# Patient Record
Sex: Female | Born: 1985 | Race: White | Hispanic: Yes | Marital: Married | Smoking: Never smoker
Health system: Southern US, Community
[De-identification: ages and names within clinical notes are randomized; demographics above are authoritative.]

## PROBLEM LIST (undated history)

## (undated) ENCOUNTER — Inpatient Hospital Stay (HOSPITAL_COMMUNITY): Payer: Self-pay

## (undated) DIAGNOSIS — Z789 Other specified health status: Secondary | ICD-10-CM

## (undated) DIAGNOSIS — K831 Obstruction of bile duct: Secondary | ICD-10-CM

## (undated) HISTORY — PX: NO PAST SURGERIES: SHX2092

## (undated) HISTORY — DX: Obstruction of bile duct: K83.1

## (undated) HISTORY — PX: OTHER SURGICAL HISTORY: SHX169

---

## 2007-07-29 ENCOUNTER — Inpatient Hospital Stay (HOSPITAL_COMMUNITY): Admission: AD | Admit: 2007-07-29 | Discharge: 2007-07-29 | Payer: Self-pay | Admitting: Gynecology

## 2007-08-05 ENCOUNTER — Inpatient Hospital Stay (HOSPITAL_COMMUNITY): Admission: RE | Admit: 2007-08-05 | Discharge: 2007-08-05 | Payer: Self-pay | Admitting: Obstetrics and Gynecology

## 2007-10-29 ENCOUNTER — Ambulatory Visit (HOSPITAL_COMMUNITY): Admission: RE | Admit: 2007-10-29 | Discharge: 2007-10-29 | Payer: Self-pay | Admitting: Obstetrics & Gynecology

## 2008-03-03 ENCOUNTER — Inpatient Hospital Stay (HOSPITAL_COMMUNITY): Admission: AD | Admit: 2008-03-03 | Discharge: 2008-03-03 | Payer: Self-pay | Admitting: Obstetrics & Gynecology

## 2008-03-03 ENCOUNTER — Ambulatory Visit: Payer: Self-pay | Admitting: *Deleted

## 2008-03-28 ENCOUNTER — Inpatient Hospital Stay (HOSPITAL_COMMUNITY): Admission: AD | Admit: 2008-03-28 | Discharge: 2008-03-28 | Payer: Self-pay | Admitting: Obstetrics and Gynecology

## 2008-03-28 ENCOUNTER — Ambulatory Visit: Payer: Self-pay | Admitting: Physician Assistant

## 2008-03-29 ENCOUNTER — Inpatient Hospital Stay (HOSPITAL_COMMUNITY): Admission: AD | Admit: 2008-03-29 | Discharge: 2008-04-01 | Payer: Self-pay | Admitting: Obstetrics & Gynecology

## 2008-03-29 ENCOUNTER — Ambulatory Visit: Payer: Self-pay | Admitting: Gynecology

## 2009-07-22 ENCOUNTER — Emergency Department (HOSPITAL_COMMUNITY): Admission: EM | Admit: 2009-07-22 | Discharge: 2009-07-22 | Payer: Self-pay | Admitting: Emergency Medicine

## 2010-05-02 ENCOUNTER — Ambulatory Visit (HOSPITAL_COMMUNITY): Admission: RE | Admit: 2010-05-02 | Discharge: 2010-05-02 | Payer: Self-pay | Admitting: Family Medicine

## 2010-06-02 ENCOUNTER — Ambulatory Visit (HOSPITAL_COMMUNITY): Admission: RE | Admit: 2010-06-02 | Discharge: 2010-06-02 | Payer: Self-pay | Admitting: Family Medicine

## 2010-09-03 ENCOUNTER — Emergency Department (HOSPITAL_BASED_OUTPATIENT_CLINIC_OR_DEPARTMENT_OTHER): Admission: EM | Admit: 2010-09-03 | Discharge: 2010-09-03 | Payer: Self-pay | Admitting: Emergency Medicine

## 2010-09-14 ENCOUNTER — Ambulatory Visit: Payer: Self-pay | Admitting: Family Medicine

## 2010-09-15 ENCOUNTER — Ambulatory Visit (HOSPITAL_COMMUNITY): Admission: RE | Admit: 2010-09-15 | Discharge: 2010-09-15 | Payer: Self-pay | Admitting: Family Medicine

## 2010-09-17 ENCOUNTER — Inpatient Hospital Stay (HOSPITAL_COMMUNITY): Admission: AD | Admit: 2010-09-17 | Discharge: 2010-09-17 | Payer: Self-pay | Admitting: Obstetrics & Gynecology

## 2010-09-18 ENCOUNTER — Ambulatory Visit: Payer: Self-pay | Admitting: Obstetrics and Gynecology

## 2010-09-21 ENCOUNTER — Ambulatory Visit: Payer: Self-pay | Admitting: Obstetrics & Gynecology

## 2010-09-25 ENCOUNTER — Ambulatory Visit: Payer: Self-pay | Admitting: Obstetrics and Gynecology

## 2010-09-28 ENCOUNTER — Encounter: Payer: Self-pay | Admitting: Family Medicine

## 2010-09-28 ENCOUNTER — Ambulatory Visit (HOSPITAL_COMMUNITY)
Admission: RE | Admit: 2010-09-28 | Discharge: 2010-09-28 | Payer: Self-pay | Source: Home / Self Care | Admitting: Family Medicine

## 2010-09-28 ENCOUNTER — Ambulatory Visit: Payer: Self-pay | Admitting: Obstetrics & Gynecology

## 2010-09-28 ENCOUNTER — Encounter (INDEPENDENT_AMBULATORY_CARE_PROVIDER_SITE_OTHER): Payer: Self-pay | Admitting: *Deleted

## 2010-09-28 LAB — CONVERTED CEMR LAB: Chlamydia, DNA Probe: NEGATIVE

## 2010-09-29 ENCOUNTER — Encounter (INDEPENDENT_AMBULATORY_CARE_PROVIDER_SITE_OTHER): Payer: Self-pay | Admitting: *Deleted

## 2010-09-30 ENCOUNTER — Inpatient Hospital Stay (HOSPITAL_COMMUNITY): Admission: AD | Admit: 2010-09-30 | Discharge: 2010-09-30 | Payer: Self-pay | Admitting: Family Medicine

## 2010-10-02 ENCOUNTER — Inpatient Hospital Stay (HOSPITAL_COMMUNITY)
Admission: AD | Admit: 2010-10-02 | Discharge: 2010-10-04 | Payer: Self-pay | Source: Home / Self Care | Admitting: Obstetrics & Gynecology

## 2010-10-06 ENCOUNTER — Inpatient Hospital Stay (HOSPITAL_COMMUNITY): Admission: AD | Admit: 2010-10-06 | Discharge: 2010-10-06 | Payer: Self-pay | Admitting: Obstetrics and Gynecology

## 2011-01-23 LAB — COMPREHENSIVE METABOLIC PANEL
ALT: 31 U/L (ref 0–35)
AST: 29 U/L (ref 0–37)
Albumin: 2.5 g/dL — ABNORMAL LOW (ref 3.5–5.2)
Alkaline Phosphatase: 216 U/L — ABNORMAL HIGH (ref 39–117)
GFR calc Af Amer: >60 mL/min (ref >60)
Potassium: 3.5 mEq/L (ref 3.5–5.1)
Sodium: 136 mEq/L (ref 135–145)
Total Protein: 5.7 g/dL — ABNORMAL LOW (ref 6.0–8.3)

## 2011-01-23 LAB — POCT URINALYSIS DIPSTICK
Glucose, UA: NEGATIVE mg/dL
Hgb urine dipstick: NEGATIVE
Ketones, ur: NEGATIVE mg/dL
Nitrite: NEGATIVE
Nitrite: POSITIVE — AB
Protein, ur: 30 mg/dL — AB
Protein, ur: NEGATIVE mg/dL
Urobilinogen, UA: 0.2 mg/dL (ref 0.0–1.0)
pH: 6 (ref 5.0–8.0)
pH: 7 (ref 5.0–8.0)

## 2011-01-23 LAB — CBC
HCT: 29.5 % — ABNORMAL LOW (ref 36.0–46.0)
HCT: 33.3 % — ABNORMAL LOW (ref 36.0–46.0)
MCHC: 34.5 g/dL (ref 30.0–36.0)
MCHC: 34.5 g/dL (ref 30.0–36.0)
MCV: 94.6 fL (ref 78.0–100.0)
RDW: 13.1 % (ref 11.5–15.5)
RDW: 13.2 % (ref 11.5–15.5)

## 2011-01-23 LAB — ABO/RH: ABO/RH(D): O POS

## 2011-02-16 LAB — POCT URINALYSIS DIP (DEVICE)
Nitrite: NEGATIVE
Protein, ur: 100 mg/dL — AB
pH: 5.5 (ref 5.0–8.0)

## 2011-02-16 LAB — URINE CULTURE: Colony Count: >=100000

## 2011-03-27 NOTE — Discharge Summary (Signed)
Kari Shaw, PETRAITIS NO.:  [id number]   MEDICAL RECORD NO.:  [id number]          PATIENT TYPE:  INP   LOCATION:  9118                          FACILITY:  [facility]   PHYSICIAN:  Allie Bossier, MD        DATE OF BIRTH:  05/19/1987   DATE OF ADMISSION:  03/29/2008  DATE OF DISCHARGE:  03/31/2008                               DISCHARGE SUMMARY   REASON FOR ADMISSION:  Spontaneous onset of labor.   DISCHARGE DIAGNOSES:  1. Term pregnancy delivered by vacuum-assisted vaginal delivery.  2. Fourth-degree perineal laceration.   DISCHARGE MEDICATIONS:  1. Ibuprofen 600 mg 1 every 6 hours p.r.n. pain.  2. Percocet 5/325 one-two every 4 hours p.r.n. pain.  3. Colace 100 mg 1 b.i.d. for constipation.  4. Prenatal vitamins 1 daily while breast feeding.   MATERNAL ADMISSION LABS:  Blood type O positive, antibody negative,  hepatitis B negative, rubella immune, HIV negative, RPR negative, and  GBS negative.   BRIEF HOSPITAL COURSE:  Kari Shaw is an 25 year old G1, now P1 who  presented to [facility] with a spontaneous onset of labor at 55 and  [redacted] weeks gestational age.  She was admitted to the labor and delivery  floor for expected management of labor at approximately 3:00 in the  morning on Mar 30, 2008.  She did have a vacuum-assisted vaginal  delivery of a viable female, vacuum was used for fetal distress with  heart rate down to 80 beats per minute, time 20 minutes with pushing.  Baby was delivered with 3 pulses of vacuum, rotated from ROA to OA.  The  patient did suffer a fourth-degree perineal laceration following a  midline episiotomy.  This was repaired with 2-0 and 3-0 Vicryl with good  hemostasis.  NICU was present at the delivery, but infant was stable.  Estimated blood loss was less than 500 mL.  Infant weight was 7 pounds  12 ounces, sex was female, and Apgars 8 and 9 at 1 and 5 minutes  respectively.  The patient was then transferred to the postpartum unit  where she did receive routine postpartum care.   DISPOSITION:  The patient will be discharged home.   CONDITION AT TIME OF DISCHARGE:  Stable.   FOLLOWUP:  The patient should follow up in 6 weeks at the health  department for her routine postpartum check.  She desires the Mirena for  contraception at her 6-week check with no bridge in between.      Ardeen Garland, MD      Allie Bossier, MD  Electronically Signed    LM/MEDQ  D:  03/31/2008  T:  03/31/2008  Job:  [id number]

## 2011-08-07 LAB — COMPREHENSIVE METABOLIC PANEL
AST: 18
Albumin: 2.8 — ABNORMAL LOW
Calcium: 8.9
Creatinine, Ser: 0.45
GFR calc Af Amer: >60
GFR calc non Af Amer: >60

## 2011-08-07 LAB — URINALYSIS, ROUTINE W REFLEX MICROSCOPIC
Bilirubin Urine: NEGATIVE
Hgb urine dipstick: NEGATIVE
Ketones, ur: NEGATIVE
Protein, ur: NEGATIVE
Urobilinogen, UA: 0.2

## 2011-08-07 LAB — CBC
MCHC: 35.7
MCV: 92.8
Platelets: 287

## 2011-08-07 LAB — URIC ACID: Uric Acid, Serum: 4.3

## 2011-08-08 LAB — CBC
HCT: 36.3
MCV: 92.2
RBC: 3.94
WBC: 9.4

## 2011-08-08 LAB — RPR: RPR Ser Ql: NONREACTIVE

## 2011-08-23 LAB — HCG, QUANTITATIVE, PREGNANCY: hCG, Beta Chain, Quant, S: 3622 — ABNORMAL HIGH

## 2011-08-23 LAB — POCT PREGNANCY, URINE: Operator id: 220991

## 2011-08-23 LAB — URINALYSIS, ROUTINE W REFLEX MICROSCOPIC
Bilirubin Urine: NEGATIVE
Glucose, UA: NEGATIVE
Ketones, ur: NEGATIVE
Nitrite: NEGATIVE
Protein, ur: NEGATIVE
pH: 6

## 2011-08-23 LAB — WET PREP, GENITAL

## 2011-08-23 LAB — GC/CHLAMYDIA PROBE AMP, GENITAL: Chlamydia, DNA Probe: NEGATIVE

## 2011-08-23 LAB — CBC
Platelets: 287
RBC: 3.92
WBC: 14.1 — ABNORMAL HIGH

## 2011-08-23 LAB — ABO/RH: ABO/RH(D): O POS

## 2012-10-04 ENCOUNTER — Emergency Department (HOSPITAL_COMMUNITY): Payer: No Typology Code available for payment source

## 2012-10-04 ENCOUNTER — Encounter (HOSPITAL_COMMUNITY): Payer: Self-pay

## 2012-10-04 ENCOUNTER — Emergency Department (HOSPITAL_COMMUNITY)
Admission: EM | Admit: 2012-10-04 | Discharge: 2012-10-04 | Disposition: A | Discharge status: Home / Self Care | Payer: No Typology Code available for payment source | Attending: Emergency Medicine | Admitting: Emergency Medicine

## 2012-10-04 DIAGNOSIS — M549 Dorsalgia, unspecified: Secondary | ICD-10-CM | POA: Insufficient documentation

## 2012-10-04 DIAGNOSIS — Y9389 Activity, other specified: Secondary | ICD-10-CM | POA: Insufficient documentation

## 2012-10-04 DIAGNOSIS — IMO0002 Reserved for concepts with insufficient information to code with codable children: Secondary | ICD-10-CM | POA: Insufficient documentation

## 2012-10-04 MED ORDER — IBUPROFEN 400 MG PO TABS
600.0000 mg | ORAL_TABLET | Freq: Once | ORAL | Status: AC
  Administered 2012-10-04: 600 mg via ORAL
  Filled 2012-10-04: qty 2

## 2012-10-04 NOTE — ED Notes (Signed)
Patient transported to X-ray 

## 2012-10-04 NOTE — ED Notes (Signed)
Pt ambulated with no problems and no complaints
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
## 2013-02-02 ENCOUNTER — Inpatient Hospital Stay (HOSPITAL_COMMUNITY): Payer: Self-pay

## 2013-02-02 ENCOUNTER — Inpatient Hospital Stay (HOSPITAL_COMMUNITY)
Admission: AD | Admit: 2013-02-02 | Discharge: 2013-02-02 | Disposition: A | Discharge status: Home / Self Care | Payer: Self-pay | Source: Ambulatory Visit | Attending: Obstetrics & Gynecology | Admitting: Obstetrics & Gynecology

## 2013-02-02 ENCOUNTER — Encounter (HOSPITAL_COMMUNITY): Payer: Self-pay | Admitting: *Deleted

## 2013-02-02 DIAGNOSIS — N921 Excessive and frequent menstruation with irregular cycle: Secondary | ICD-10-CM

## 2013-02-02 DIAGNOSIS — A499 Bacterial infection, unspecified: Secondary | ICD-10-CM

## 2013-02-02 DIAGNOSIS — R591 Generalized enlarged lymph nodes: Secondary | ICD-10-CM

## 2013-02-02 DIAGNOSIS — Z3202 Encounter for pregnancy test, result negative: Secondary | ICD-10-CM | POA: Insufficient documentation

## 2013-02-02 DIAGNOSIS — R599 Enlarged lymph nodes, unspecified: Secondary | ICD-10-CM | POA: Insufficient documentation

## 2013-02-02 DIAGNOSIS — N760 Acute vaginitis: Secondary | ICD-10-CM | POA: Insufficient documentation

## 2013-02-02 DIAGNOSIS — B9689 Other specified bacterial agents as the cause of diseases classified elsewhere: Secondary | ICD-10-CM | POA: Insufficient documentation

## 2013-02-02 LAB — URINE MICROSCOPIC-ADD ON

## 2013-02-02 LAB — ABO/RH: ABO/RH(D): O POS

## 2013-02-02 LAB — GC/CHLAMYDIA PROBE AMP: GC Probe RNA: NEGATIVE

## 2013-02-02 LAB — URINALYSIS, ROUTINE W REFLEX MICROSCOPIC
Ketones, ur: NEGATIVE mg/dL
Leukocytes, UA: NEGATIVE
Nitrite: NEGATIVE
Protein, ur: 30 mg/dL — AB

## 2013-02-02 LAB — CBC
Hemoglobin: 11.3 g/dL — ABNORMAL LOW (ref 12.0–15.0)
MCHC: 34.1 g/dL (ref 30.0–36.0)
RBC: 3.78 MIL/uL — ABNORMAL LOW (ref 3.87–5.11)

## 2013-02-02 LAB — WET PREP, GENITAL

## 2013-02-02 LAB — HCG, QUANTITATIVE, PREGNANCY: hCG, Beta Chain, Quant, S: <1 m[IU]/mL (ref <5)

## 2013-02-02 MED ORDER — METRONIDAZOLE 500 MG PO TABS: 500.0000 mg | ORAL_TABLET | Freq: Two times a day (BID) | ORAL | Status: DC

## 2013-02-02 NOTE — MAU Provider Note (Signed)
Chief Complaint: Possible Pregnancy and Vaginal Bleeding   First Provider Initiated Contact with Patient 02/02/13 1039     SUBJECTIVE HPI: Kari Shaw is a 27 y.o. G2P2002 at who presents to maternity admissions reporting IUD x2 years and positive home preg test yesterday.  She usually has light menses monthly but has not had menses this month, only spotting.  She is having mild abdominal cramping and vaginal discharge with burning. She denies vaginal bleeding, urinary symptoms, h/a, dizziness, n/v, or fever/chills.    Hospital spanish interpreter used for all communication.   History reviewed. No pertinent past medical history. Past Surgical History  Procedure Laterality Date  . Childbirth     History   Social History  . Marital Status: Married    Spouse Name: N/A    Number of Children: N/A  . Years of Education: N/A   Occupational History  . Not on file.   Social History Main Topics  . Smoking status: Never Smoker   . Smokeless tobacco: Never Used  . Alcohol Use: No  . Drug Use: No  . Sexually Active: Yes    Birth Control/ Protection: IUD   Other Topics Concern  . Not on file   Social History Narrative  . No narrative on file   No current facility-administered medications on file prior to encounter.   No current outpatient prescriptions on file prior to encounter.   Allergies  Allergen Reactions  . Latex Itching and Other (See Comments)    Gloves    ROS: Pertinent items in HPI  OBJECTIVE Blood pressure 115/72, pulse 76, temperature 98.4 F (36.9 C), temperature source Oral, resp. rate 20, height 5' 0.5" (1.537 m), weight 65.681 kg (144 lb 12.8 oz), last menstrual period 12/03/2012. GENERAL: Well-developed, well-nourished female in no acute distress.  HEENT: Normocephalic Tonsillar lymphadenopathy palpable, soft, spongy; cobblestoning posterior tongue; erythematous oropharynx; TMs grey with good cone of light HEART: normal rate RESP: normal  effort ABDOMEN: Soft, non-tender EXTREMITIES: Nontender, no edema NEURO: Alert and oriented Pelvic exam: Cervix pink, visually closed, without lesion, scant clear discharge, no visible Mirena string, vaginal walls and external genitalia normal Bimanual exam: Cervix 0/long/high, firm, anterior, neg CMT, uterus nontender, nonenlarged, adnexa with mild tenderness on right,  no enlargement or mass  LAB RESULTS Results for orders placed during the hospital encounter of 02/02/13 (from the past 24 hour(s))  URINALYSIS, ROUTINE W REFLEX MICROSCOPIC     Status: Abnormal   Collection Time    02/02/13  9:30 AM      Result Value Range   Color, Urine YELLOW  YELLOW   APPearance CLOUDY (*) CLEAR   Specific Gravity, Urine >1.030 (*) 1.005 - 1.030   pH 5.5  5.0 - 8.0   Glucose, UA NEGATIVE  NEGATIVE mg/dL   Hgb urine dipstick LARGE (*) NEGATIVE   Bilirubin Urine NEGATIVE  NEGATIVE   Ketones, ur NEGATIVE  NEGATIVE mg/dL   Protein, ur 30 (*) NEGATIVE mg/dL   Urobilinogen, UA 0.2  0.0 - 1.0 mg/dL   Nitrite NEGATIVE  NEGATIVE   Leukocytes, UA NEGATIVE  NEGATIVE  URINE MICROSCOPIC-ADD ON     Status: Abnormal   Collection Time    02/02/13  9:30 AM      Result Value Range   Squamous Epithelial / LPF MANY (*) RARE   WBC, UA 0-2  <3 WBC/hpf   RBC / HPF 7-10  <3 RBC/hpf   Urine-Other AMORPHOUS URATES/PHOSPHATES    POCT PREGNANCY, URINE  Status: None   Collection Time    02/02/13  9:32 AM      Result Value Range   Preg Test, Ur NEGATIVE  NEGATIVE  WET PREP, GENITAL     Status: Abnormal   Collection Time    02/02/13 10:54 AM      Result Value Range   Yeast Wet Prep HPF POC NONE SEEN  NONE SEEN   Trich, Wet Prep NONE SEEN  NONE SEEN   Clue Cells Wet Prep HPF POC MODERATE (*) NONE SEEN   WBC, Wet Prep HPF POC MANY (*) NONE SEEN  HCG, QUANTITATIVE, PREGNANCY     Status: None   Collection Time    02/02/13 11:05 AM      Result Value Range   hCG, Beta Chain, Quant, S <1  <5 mIU/mL  CBC      Status: Abnormal   Collection Time    02/02/13 11:05 AM      Result Value Range   WBC 11.4 (*) 4.0 - 10.5 K/uL   RBC 3.78 (*) 3.87 - 5.11 MIL/uL   Hemoglobin 11.3 (*) 12.0 - 15.0 g/dL   HCT 16.1 (*) 09.6 - 04.5 %   MCV 87.6  78.0 - 100.0 fL   MCH 29.9  26.0 - 34.0 pg   MCHC 34.1  30.0 - 36.0 g/dL   RDW 40.9  81.1 - 91.4 %   Platelets 321  150 - 400 K/uL  ABO/RH     Status: None   Collection Time    02/02/13 11:05 AM      Result Value Range   ABO/RH(D) O POS      IMAGING US Transvaginal Non-ob  02/02/2013  *RADIOLOGY REPORT*  Clinical Data: Previous IUD placement, string not visualized.  The patient reports a home pregnancy test was positive.  Right lower quadrant pelvic pain.  TRANSABDOMINAL AND TRANSVAGINAL ULTRASOUND OF PELVIS  Technique:  Both transabdominal and transvaginal ultrasound examinations of the pelvis were performed.  Transabdominal technique was performed for global imaging of the pelvis including uterus, ovaries, adnexal regions, and pelvic cul-de-sac.  It was necessary to proceed with endovaginal exam following the transabdominal exam to visualize the endometrium and ovaries.  Comparison:  None.  Findings: Uterus:  Anteverted, anteflexed.  9.3 x 6.1 x 3.8 cm.  No focal abnormality.  Endometrium: 5 mm.  IUD appropriately located in the uterine fundus/body endometrial canal.  No gestational sac identified.  Right ovary: 3.5 x 2.7 x 2.3 cm.  Normal.  Left ovary: 2.5 x 2.1 x 1.7 cm.  Normal.  Other Findings:  Trace free fluid in the cul-de-sac.  IMPRESSION: Appropriate IUD location.  Quantitative beta HCG is currently pending at this institution.  If this is positive, differential considerations would include intrauterine pregnancy too early to be sonographically visible, ectopic pregnancy, or spontaneous abortion.  No gestational sac is visualized on today's exam.   Original Report Authenticated By: Christiana Pellant, M.D.    US Pelvis Complete  02/02/2013  *RADIOLOGY REPORT*   Clinical Data: Previous IUD placement, string not visualized.  The patient reports a home pregnancy test was positive.  Right lower quadrant pelvic pain.  TRANSABDOMINAL AND TRANSVAGINAL ULTRASOUND OF PELVIS  Technique:  Both transabdominal and transvaginal ultrasound examinations of the pelvis were performed.  Transabdominal technique was performed for global imaging of the pelvis including uterus, ovaries, adnexal regions, and pelvic cul-de-sac.  It was necessary to proceed with endovaginal exam following the transabdominal exam to visualize the endometrium and  ovaries.  Comparison:  None.  Findings: Uterus:  Anteverted, anteflexed.  9.3 x 6.1 x 3.8 cm.  No focal abnormality.  Endometrium: 5 mm.  IUD appropriately located in the uterine fundus/body endometrial canal.  No gestational sac identified.  Right ovary: 3.5 x 2.7 x 2.3 cm.  Normal.  Left ovary: 2.5 x 2.1 x 1.7 cm.  Normal.  Other Findings:  Trace free fluid in the cul-de-sac.  IMPRESSION: Appropriate IUD location.  Quantitative beta HCG is currently pending at this institution.  If this is positive, differential considerations would include intrauterine pregnancy too early to be sonographically visible, ectopic pregnancy, or spontaneous abortion.  No gestational sac is visualized on today's exam.   Original Report Authenticated By: Christiana Pellant, M.D.     ASSESSMENT 1. Bacterial vaginosis   2. Breakthrough bleeding with IUD   3. Lymphadenopathy     PLAN Discharge home Flagyl BID x 7 days Rest, increase PO fluids, Tylenol or ibuprofen for URI symptoms/pain Return to MAU or see primary care for follow up (Eda discussed with pt clinic options for Spanish-speaking)    Medication List    TAKE these medications       metroNIDAZOLE 500 MG tablet  Commonly known as:  FLAGYL  Take 1 tablet (500 mg total) by mouth 2 (two) times daily.           Follow-up Information   Follow up with [facility]  AD[facility]. (Or primary care provider)    Contact information:   [address] [id number] [place] [address] [phone]      Sharen Counter Certified Nurse-Midwife 02/02/2013  12:53 PM

## 2013-02-02 NOTE — MAU Note (Signed)
Patient states she has had a Mirena IUD for two years. Usually has a period but only had spotting this month. Had a positive home pregnancy test yesterday. Has been having off and on pain on the right side, none now. Bright red to dark coffee color spotting only with wiping. Eda Royal in for triage.

## 2013-02-02 NOTE — MAU Provider Note (Signed)
Attestation of Attending Supervision of Advanced Practitioner (CNM/NP): Evaluation and management procedures were performed by the Advanced Practitioner under my supervision and collaboration. I have reviewed the Advanced Practitioner's note and chart, and I agree with the management and plan.  Pleasant Britz H. 1:33 PM   

## 2013-06-11 ENCOUNTER — Ambulatory Visit: Payer: Self-pay | Admitting: Emergency Medicine

## 2013-06-11 VITALS — BP 112/60 | HR 60 | Temp 98.1°F | Resp 16 | Ht 60.0 in | Wt 140.2 lb

## 2013-06-11 DIAGNOSIS — N920 Excessive and frequent menstruation with regular cycle: Secondary | ICD-10-CM

## 2013-06-11 DIAGNOSIS — N3000 Acute cystitis without hematuria: Secondary | ICD-10-CM

## 2013-06-11 DIAGNOSIS — R109 Unspecified abdominal pain: Secondary | ICD-10-CM

## 2013-06-11 LAB — POCT URINALYSIS DIPSTICK
Glucose, UA: NEGATIVE
Nitrite, UA: NEGATIVE
Protein, UA: NEGATIVE
Urobilinogen, UA: 0.2

## 2013-06-11 LAB — POCT CBC
HCT, POC: 37.5 % — AB (ref 37.7–47.9)
Hemoglobin: 12.1 g/dL — AB (ref 12.2–16.2)
Lymph, poc: 3.2 (ref 0.6–3.4)
MCH, POC: 30.3 pg (ref 27–31.2)
MCHC: 32.3 g/dL (ref 31.8–35.4)
MCV: 93.7 fL (ref 80–97)
MPV: 9.5 fL (ref 0–99.8)
POC MID %: 10.4 %M (ref 0–12)
RBC: 4 M/uL — AB (ref 4.04–5.48)
WBC: 7.4 10*3/uL (ref 4.6–10.2)

## 2013-06-11 LAB — POCT UA - MICROSCOPIC ONLY
Crystals, Ur, HPF, POC: NEGATIVE
Yeast, UA: NEGATIVE

## 2013-06-11 LAB — POCT URINE PREGNANCY: Preg Test, Ur: NEGATIVE

## 2013-06-11 MED ORDER — CIPROFLOXACIN HCL 500 MG PO TABS: 500.0000 mg | ORAL_TABLET | Freq: Two times a day (BID) | ORAL | Status: DC

## 2013-06-11 NOTE — Progress Notes (Signed)
[facility] [address], [place] [address] [phone]  Date:  06/11/2013   Name:  Kari Shaw   DOB:  October 09, 1986   MRN:  [id number]  PCP:  No PCP Per Patient    Chief Complaint: Headache   History of Present Illness:  Kari Shaw is a 27 y.o. very pleasant female patient who presents with the following:  Has dysuria, urgency and frequency with no blood in urine or foul odor for this week.  LMP 7/22.  Is concerned that she may be pregnant despite an IUD and did a HPT which was positive today.  She has no fever or chills, nausea or vomiting.  No breast tenderness.  Her last was marked by heavy bleeding.  Has a headache today which is worse than her usual headache.  No improvement with over the counter medications or other home remedies. Denies other complaint or health concern today.   There are no active problems to display for this patient.   No past medical history on file.  No past surgical history on file.  History  Substance Use Topics  . Smoking status: Never Smoker   . Smokeless tobacco: Not on file  . Alcohol Use: Not on file    No family history on file.  No Known Allergies  Medication list has been reviewed and updated.  No current outpatient prescriptions on file prior to visit.   No current facility-administered medications on file prior to visit.    Review of Systems:  As per HPI, otherwise negative.    Physical Examination: Filed Vitals:   06/11/13 2009  BP: 112/60  Pulse: 60  Temp: 98.1 F (36.7 C)  Resp: 16   Filed Vitals:   06/11/13 2009  Height: 5' (1.524 m)  Weight: 140 lb 3.2 oz (63.594 kg)   Body mass index is 27.38 kg/(m^2). Ideal Body Weight: Weight in (lb) to have BMI = 25: 127.7  GEN: WDWN, NAD, Non-toxic, A & O x 3 HEENT: Atraumatic, Normocephalic. Neck supple. No masses, No LAD. Ears and Nose: No external deformity. CV: RRR, No M/G/R. No JVD. No thrill. No extra heart sounds. PULM: CTA B, no  wheezes, crackles, rhonchi. No retractions. No resp. distress. No accessory muscle use. ABD: S, NT, ND, +BS. No rebound. No HSM. EXTR: No c/c/e NEURO Normal gait.  PSYCH: Normally interactive. Conversant. Not depressed or anxious appearing.  Calm demeanor.    Assessment and Plan:    Signed,  Phillips Odor, MD

## 2013-06-11 NOTE — Patient Instructions (Addendum)
Informacin sobre el dispositivo intrauterino  (Intrauterine Device Information) El dispositivo intrauterino (DIU) se inserta en el tero e impide el embarazo. Hay dos tipos de DIU:   DIU de cobre. Este tipo de DIU est recubierto con un alambre de cobre y se inserta dentro del tero. El cobre hace que el tero y las trompas de Falopio produzcan un liquido que [employer] espermatozoides. El DIU de cobre puede Geneticist, molecular durante 10 aos.  DIU hormonal. Este tipo de DIU contiene la hormona progestina (progesterona sinttica). La hormona espesa el moco cervical y evita que los espermatozoides ingresen al tero y tambin afina la membrana que cubre el tero para evitar la implantacin del vulo fertilizado. La hormona debilita o destruye los espermatozoides que ingresan al tero. El DIU hormonal puede Geneticist, molecular durante 5 aos. El mdico se asegurar de que usted es una buena candidata para usar el DIU cono anticonceptivo. Hable con su mdico acerca de los posibles efectos secundarios.  VENTAJAS  Es muy eficaz, reversible, de accin prolongada y de bajo mantenimiento.  No hay efectos secundarios relacionados con el estrgeno.  El DIU puede ser utilizado durante la Market researcher.  No est asociado con el aumento de [place].  Funciona inmediatamente despus de la insercin.  El DIU de cobre no interfiere con las hormonas femeninas.  El DIU con progesterona puede hacer que los perodos menstruales no sean tan abundantes.  El DIU de progesterona puede usarse durante 5 aos.  El DIU de cobre puede usarse durante 10 aos. DESVENTAJAS  El DIU de progesterona puede estar asociado con patrones de sangrado irregular.  El DIU de cobre puede hacer que el flujo menstrual ms abundante y doloroso.  Puede experimentar clicos y sangrado vaginal despus de la insercin. Document Released: 04/18/2010 Document Revised: 01/21/2012 [facility] Patient Information 2014 [place],  [place]. Menorragia (Menorrhagia) La hemorragia uterina (sangrado del tero) disfuncional es diferente del perodo menstrual normal. Cuando los perodos son irregulares o hay ms hemorragia que lo habitual en usted, se denomina menorragia. La causa puede ser un desequilibrio hormonal, fsico o metablico u otros problemas. Es necesario Medical sales representative examen para que el profesional pueda tratar [employer] causas que son tratables. Si el problema contina, ser necesario realizar una dilatacin y curetaje. Este procedimiento [employer] en dilatar el cuello del tero (la apertura del tero o matriz), es decir, se lo estira para Technical brewer, y se raspa la superficie que tapiza el interior del tero. Un especialista (patlogo) examina en el microscopio el tejido que se retira para asegurarse que no haya nada preocupante que requiera un examen ms exhaustivo. INSTRUCCIONES PARA EL CUIDADO DOMICILIARIO  Si el profesional que la asiste le prescribe medicamentos, tmelos tal como se le indic. No cambie ni reemplace medicamentos sin consultarlo con Mining engineer.  Las hemorragias de larga duracin pueden tener como consecuencia un dficit de hierro. El profesional que la asiste podr prescribirle comprimidos de hierro. Esto ayuda a Scientific laboratory technician que el organismo pierde luego de una hemorragia abundante. Tome los medicamentos tal como se le indic. El hierro puede causarle estreimiento. Si esto es un problema, aumente el consumo de [place], frutas y [place].  No tome aspirina o medicamentos que la contengan desde una semana antes del perodo menstrual ni durante el mismo. La aspirina puede hacer que la hemorragia empeore.  Si necesita cambiar el apsito o el tampn mas de una vez cada 2 horas, permanezca en cama y descanse todo lo posible hasta que la  hemorragia se detenga.  Consuma alimentos balanceados. Coma alimentos ricos en hierro. Por ejemplo, verduras de [employer]ott, carne, hgado, huevos y panes y Medical laboratory scientific officer  de grano integral. No trate de perder peso hasta que la hemorragia anormal se detenga y los niveles de hierro en la sangre vuelvan a la normalidad. SOLICITE ATENCIN MDICA SI:  Debe cambiar el apsito o el tampn ms de una vez cada hora.  Si tiene nuseas (ganas de vomitar), vmitos, mareos o diarrea mientras toma los medicamentos.  Tiene algn problema que pueda relacionarse con el medicamento que est tomando. SOLICITE ATENCIN MDICA DE INMEDIATO SI:  Tiene fiebre.  Siente escalofros.  Sufre una hemorragia intensa o comienza a eliminar cogulos de [place].  Se siente mareado o sufre un desmayo. EST SEGURO QUE:   Comprende las instrucciones para el alta mdica.  Controlar su enfermedad.  Solicitar atencin mdica de inmediato segn las indicaciones. Document Released: 08/08/2005 Document Revised: 01/21/2012 [facility] Patient Information 2014 [place], [place].

## 2014-01-04 ENCOUNTER — Encounter (HOSPITAL_COMMUNITY): Payer: Self-pay | Admitting: Emergency Medicine

## 2014-01-04 ENCOUNTER — Emergency Department (INDEPENDENT_AMBULATORY_CARE_PROVIDER_SITE_OTHER)
Admission: EM | Admit: 2014-01-04 | Discharge: 2014-01-04 | Disposition: A | Discharge status: Home / Self Care | Payer: Self-pay | Source: Home / Self Care | Attending: Emergency Medicine | Admitting: Emergency Medicine

## 2014-01-04 DIAGNOSIS — B009 Herpesviral infection, unspecified: Secondary | ICD-10-CM

## 2014-01-04 LAB — POCT RAPID STREP A: STREPTOCOCCUS, GROUP A SCREEN (DIRECT): NEGATIVE

## 2014-01-04 MED ORDER — ACYCLOVIR 400 MG PO TABS: ORAL_TABLET | ORAL | Status: AC

## 2014-01-04 MED ORDER — MAGIC MOUTHWASH W/LIDOCAINE: 5.0000 mL | Freq: Four times a day (QID) | ORAL | Status: DC | PRN

## 2014-01-04 NOTE — ED Provider Notes (Signed)
  Chief Complaint   Chief Complaint  Patient presents with  . Sore Throat    History of Present Illness   Kari Shaw is a 28 year old female who's had a one-week history of fever, sores in her mouth, sore throat, right ear pain, and headache. She denies any nasal congestion, rhinorrhea, cough, or GI symptoms.  Review of Systems   Other than as noted above, the patient denies any of the following symptoms: Systemic:  No fevers, chills, or sweats. Eye:  No redness, pain, discharge, itching, blurred vision, or diplopia. ENT:  No headache, nasal congestion, sneezing, itching, epistaxis, ear pain, congestion, decreased hearing, ringing in ears, vertigo, or tinnitus.  No oral lesions, sore throat, pain on swallowing, or hoarseness. Neck:  No mass, tenderness or adenopathy. Lungs:  No coughing, wheezing, or shortness of breath. Skin:  No rash or itching.  PMFSH   Past medical history, family history, social history, meds, and allergies were reviewed.   Physical Examination     Vital signs:  BP 112/63  Pulse 84  Temp(Src) 98.1 F (36.7 C) (Oral)  Resp 22  SpO2 100% General:  Alert and oriented.  In no distress.  Skin warm and dry. Eye:  PERRL, full EOMs, lids and conjunctiva normal.   ENT:  TMs and canals clear.  Nasal mucosa not congested and without drainage.  Mucous membranes moist, she has multiple, shallow intraoral ulcers on her hard and soft palate, gingiva, and even in between her teeth, normal dentition, pharynx clear.  No cranial or facial pain to palplation. Neck:  Supple, full ROM.  No adenopathy, tenderness or mass.  Thyroid normal. Lungs:  Breath sounds clear and equal bilaterally.  No wheezes, rales or rhonchi. Heart:  Rhythm regular, without extrasystoles.  No gallops or murmers. Skin:  Clear, warm and dry.  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  Plan    1.  Meds:  The following meds were prescribed:   Discharge Medication List as of 01/04/2014  3:23 PM    START taking these medications   Details  acyclovir (ZOVIRAX) 400 MG tablet 1 tablet every 4 hours 5 times daily, Normal    Alum & Mag Hydroxide-Simeth (MAGIC MOUTHWASH W/LIDOCAINE) SOLN Take 5 mLs by mouth 4 (four) times daily as needed for mouth pain., Starting 01/04/2014, Until Discontinued, Normal        2.  Patient Education/Counseling:  The patient was given appropriate handouts, self care instructions, and instructed in symptomatic relief.  Suggested a soft, bland diet.  3.  Follow up:  The patient was told to follow up here if no better in 3 to 4 days, or sooner if becoming worse in any way, and given some red flag symptoms such as difficulty swelling or breathing which would prompt immediate return.  Followup here if needed.     Reuben Likesavid C Loanne Emery, MD 01/04/14 2200

## 2014-01-04 NOTE — ED Notes (Signed)
Pt  Reports    Earache   And  sorethroat        For  sev  Days   Afebrile  At this  Time        Ambulated          To  Room  With a  Steady  Fluid  Gait               Child  Is  Ill       As  Well

## 2014-01-04 NOTE — Discharge Instructions (Signed)
Herpes simple (Herpes Simplex) El herpes simple generalmente se clasifica en tipo 1 o tipo 2. Por lo general, el tipo 1 es el responsable de las lceras peribucales. El tipo 2 generalmente se relaciona con las enfermedades de transmisin sexual. Ahora sabemos que mucho de lo que pensbamos sobre estos virus no es exacto. Se ha descubierto que el HSV1 tambin est presente en los genitales y el HSV2 puede presentarse en la boca, aunque esto variar en las diferentes zonas del mundo. El herpes simple usualmente se detecta mediante un cultivo. Los anlisis de sangre tambin estn disponibles para este virus, sin embargo, la precisin a menudo no es tan adecuada.  PREPARACIN PARA EL ESTUDIO No se requiere una preparacin especial ni ayunar. HALLAZGOS NORMALES  No se observa virus.  No se observan anticuerpos o antgenos de HSV. Los rangos para los resultados normales pueden variar entre diferentes laboratorios y hospitales. Consulte siempre con su mdico despus de hacer el estudio para conocer el significado de los resultados y si los valores se consideran "dentro de los lmites normales". SIGNIFICADO DEL ESTUDIO  El mdico leer los resultados y hablar con usted sobre la importancia y el significado de los resultados, as como las opciones de tratamiento y la necesidad de realizar pruebas adicionales, si fuera necesario. OBTENCIN DE LOS RESULTADOS DE LAS PRUEBAS  Es su responsabilidad retirar el resultado del estudio. Consulte en el laboratorio cundo y cmo podr obtener los resultados. Document Released: 08/19/2013 ExitCare Patient Information 2014 ExitCare, LLC.  

## 2014-01-06 LAB — CULTURE, GROUP A STREP

## 2014-08-12 ENCOUNTER — Inpatient Hospital Stay (HOSPITAL_COMMUNITY)
Admit: 2014-08-12 | Discharge: 2014-08-12 | Disposition: A | Discharge status: Home / Self Care | Payer: Self-pay | Source: Ambulatory Visit | Attending: Obstetrics & Gynecology | Admitting: Obstetrics & Gynecology

## 2014-08-12 DIAGNOSIS — N926 Irregular menstruation, unspecified: Secondary | ICD-10-CM

## 2014-08-12 DIAGNOSIS — N910 Primary amenorrhea: Secondary | ICD-10-CM

## 2014-08-12 DIAGNOSIS — Z3202 Encounter for pregnancy test, result negative: Secondary | ICD-10-CM | POA: Insufficient documentation

## 2014-08-12 LAB — POCT PREGNANCY, URINE: Preg Test, Ur: NEGATIVE

## 2014-08-12 NOTE — MAU Note (Signed)
URINE IN LAB 

## 2014-08-12 NOTE — MAU Note (Addendum)
Wanting to know if she is preg.   6 + tests at home last wk.  Went to health dept, had neg urine test and neg blood test.  Came here because she believes she is preg, feels the same way she did when she was preg.  IUD removed AUg 3rd

## 2014-08-12 NOTE — Discharge Instructions (Signed)
Menstruacin ( Menstruation) La menstruacin es la eliminacin mensual de [place], tejidos, lquidos y mucosidad, tambin conocida como perodo. El organismo elimina el revestimiento del tero. El flujo, o la cantidad de [place], generalmente dura [place] 3 y 7das cada mes. Las hormonas son las que controlan el ciclo menstrual. Las hormonas son sustancias qumicas generadas por las glndulas endocrinas para regular las distintas funciones del organismo. El primer perodo menstrual puede comenzar [employer] 8 y los 16aos. Sin embargo, generalmente comienza alrededor Ball Corporation. Algunas nias tienen perodos menstruales regulares desde el comienzo. No obstante, no es inusual eliminar solo unas cuantas gotas de sangre o tener un manchado menstrual cuando recin se comienza a Librarian, academic. Tampoco es inusual Yahoo[employer] Inc perodos al mes o saltearse uno o dos cuando estos recin comienzan. SNTOMAS   Clicos abdominales leves a moderados.  Dolor en la parte inferior de la espalda. Los sntomas se pueden presentar entre 5 a 10das antes de que comience el perodo menstrual. Estos sntomas se conocen como sndrome premenstrual (SPM) y pueden incluir los siguientes:  Dolor de [place].  Sensibilidad e hinchazn en las mamas.  Hinchazn.  Cansancio (fatiga).  Cambios en el humor.  Ansiedad por consumir ciertos alimentos. Estos son signos y sntomas normales y Engineer, petroleum. Para ayudar a [employer], pregntele al mdico si puede tomar medicamentos de venta libre para Conservation officer, historic buildings o los [place]. Si los sntomas no se pueden controlar, consulte con el mdico.  HORMONAS QUE INTERVIENEN EN LA MENSTRUACIN La menstruacin ocurre debido a las hormonas producidas por la hipfisis en el cerebro y los ovarios que afectan al revestimiento del tero. Primero, la hipfisis en el cerebro produce la hormona folculoestimulante ([place], por sus siglas en ingls). La [place] estimula a los ovarios  para que produzcan estrgeno, el cual engrosa el revestimiento del tero y comienza a Actor un vulo en el ovario. Aproximadamente 14 das despus, la hipfisis produce otra hormona llamada hormona luteinizante (LH, por sus siglas en ingls). La LH hace que el vulo salga de la cavidad en el ovario (ovulacin). La prolactina, otra hormona de la hipfisis, estimula la cavidad vaca en el ovario, llamada cuerpo lteo. El cuerpo lteo comienza a [employer], el estrgeno y Field seismologist. La progesterona prepara al revestimiento del tero para recibir el vulo fecundado (vulo combinado con espermatozoide) y para que este se adhiera al revestimiento del tero y comience a desarrollarse en un feto. Si el vulo no fue fecundado, el cuerpo lteo deja de producir estrgeno y Immunologist, desaparece, el revestimiento del tero se desintegra y comienza el perodo menstrual. Luego comienza el ciclo menstrual nuevamente y Clinical biochemist todos los meses, a menos que ocurra un Media planner o comience la menopausia. La secrecin de hormonas es un proceso complejo. Varias partes del organismo estn involucradas en muchas actividades qumicas. Las hormonas sexuales femeninas tambin cumplen otras funciones en el organismo de la [place]. El estrgeno [place] deseo sexual de Retail banker (libido). Es un diurtico natural ya que ayuda al organismo a deshacerse de los lquidos. Tambin interviene en el proceso de formacin los [place]. Por lo tanto, Theatre manager la salud hormonal es fundamental para todos los niveles del bienestar de la [place]. Estas hormonas generalmente se encuentran en cantidades normales y son las responsables de Hydrographic surveyor. Lo crtico es la relacin [employer] (reducidos) de estas hormonas. Cuando el equilibrio se [place], se producen irregularidades menstruales. Cmo se produce el ciclo menstrual?  Los ciclos menstruales varan en  duracin de 21 a 35das, con un promedio de 29das. El ciclo  comienza Engineer, manufacturing systems en que se produce el sangrado. En este momento, la hipfisis en el cerebro libera FSH, que viaja a travs del torrente Omnicare. La [place] estimula los folculos en los ovarios. Esto prepara al organismo para la ovulacin que ocurre aproximadamente el da14 del ciclo. Los ovarios liberan estrgenos y esto garantiza que las condiciones en el tero sean las adecuadas para la implantacin del vulo fecundado.  Cuando los niveles de estrgenos alcanzan un nivel suficientemente elevado, le envan una seal a la glndula en el cerebro (hipfisis) para que libere una cantidad determinada de LH. Esto provoca la liberacin del vulo maduro del folculo (ovulacin). Generalmente, un folculo [place] solo un vulo, pero a veces libera ms de un vulo, especialmente cuando se estimulan los ovarios para la fertilizacin in vitro. Luego, el vulo puede instalarse en la trompa de Falopio y [place]. El folculo que estall dentro del ovario, y que [place], ahora se denomina cuerpo lteo o "cuerpo amarillo". El cuerpo lteo sigue liberando (segregando) cantidades reducidas de estrgeno. Esto cierra y endurece el cuello del tero. Seca la mucosidad y la lleva a un estado natural de infertilidad.  El cuerpo lteo tambin comienza a Financial controller cantidades ms grandes de Immunologist. Esto hace que el revestimiento del tero (endometrio) se vuelva an ms grueso para prepararse para el vulo fecundado. El vulo comienza su trayecto [place], desde las trompas de [employer]. Y le enva a los ovarios la seal para que no liberen ms vulos. Interviene en el regreso del mucus cervical a su estado de infertilidad.  Si el vulo se implanta exitosamente en el tejido que recubre al tero y se produce el [place], los niveles de progesterona continuarn [place]. Generalmente, esta es la hormona que les brinda a algunas mujeres embarazadas una sensacin de [place],  parecida a una "euforia natural". Los niveles de progesterona vuelven a Actuary despus del parto  Si no hay fecundacin, el cuerpo lteo muere y deja de producir hormonas. Esta disminucin repentina de progesterona provoca la desintegracin del revestimiento del tero acompaada de sangre ([place]).  Esto reinicia Contractor da1 y todo el proceso comienza nuevamente. Las mujeres atraviesan este ciclo todos los meses, desde la pubertad a la menopausia. El ciclo solo se interrumpe Solicitor y Customer service manager (Transport planner), a menos que la mujer tenga problemas de salud que afecten al sistema hormonal femenino o elija tomar anticonceptivos orales para tener perodos menstruales no naturales. INSTRUCCIONES PARA EL CUIDADO EN EL HOGAR   Use un calendario para llevar un registro de sus perodos.  Si [place] tampones, compre los menos absorbentes para evitar el sndrome del choque txico.  No deje los tampones en la vagina toda la noche ni por un perodo mayor a 6horas.  Durante la noche use una toalla higinica.  Haga ejercicios de 3 a 5 veces por semana o ms.  Evite los alimentos y las bebidas que sabe que empeorarn sus sntomas antes o durante el perodo. SOLICITE ATENCIN MDICA SI:   Tiene fiebre con el perodo.  Los perodos duran ms de 7das.  El perodo es tan abundante que debe cambiarse las toallas higinicas o los tampones cada 64minutos.  Presenta cogulos con el perodo y nunca antes los [place].  No logra aliviar los sntomas con medicamentos de [place].  Su perodo no ha comenzado y ya han pasado ms  de 35das. Document Released: 08/08/2005 Document Revised: 11/03/2013 [facility] Patient Information 2015 [place], [place]. This information is not intended to replace advice given to you by your health care provider. Make sure you discuss any questions you have with your health care provider.

## 2014-09-13 ENCOUNTER — Encounter (HOSPITAL_COMMUNITY): Payer: Self-pay | Admitting: Emergency Medicine

## 2017-05-30 LAB — OB RESULTS CONSOLE ANTIBODY SCREEN: ANTIBODY SCREEN: NEGATIVE

## 2017-05-30 LAB — OB RESULTS CONSOLE ABO/RH: RH TYPE: POSITIVE

## 2017-05-30 LAB — CYSTIC FIBROSIS DIAGNOSTIC STUDY: Interpretation-CFDNA:: NEGATIVE

## 2017-05-30 LAB — OB RESULTS CONSOLE RPR: RPR: NONREACTIVE

## 2017-05-30 LAB — OB RESULTS CONSOLE HGB/HCT, BLOOD
HEMATOCRIT: 34
HEMOGLOBIN: 11.3

## 2017-05-30 LAB — OB RESULTS CONSOLE HIV ANTIBODY (ROUTINE TESTING): HIV: NONREACTIVE

## 2017-05-30 LAB — OB RESULTS CONSOLE PLATELET COUNT: Platelets: 307

## 2017-05-30 LAB — OB RESULTS CONSOLE GC/CHLAMYDIA
Chlamydia: NEGATIVE
GC PROBE AMP, GENITAL: NEGATIVE

## 2017-05-30 LAB — SICKLE CELL SCREEN: Sickle Cell Screen: NORMAL

## 2017-05-30 LAB — OB RESULTS CONSOLE RUBELLA ANTIBODY, IGM: Rubella: IMMUNE

## 2017-05-30 LAB — OB RESULTS CONSOLE VARICELLA ZOSTER ANTIBODY, IGG: Varicella: IMMUNE

## 2017-05-30 LAB — OB RESULTS CONSOLE HEPATITIS B SURFACE ANTIGEN: HEP B S AG: NEGATIVE

## 2017-05-31 ENCOUNTER — Other Ambulatory Visit (HOSPITAL_COMMUNITY): Payer: Self-pay | Admitting: Nurse Practitioner

## 2017-05-31 DIAGNOSIS — Z3682 Encounter for antenatal screening for nuchal translucency: Secondary | ICD-10-CM

## 2017-05-31 DIAGNOSIS — Z3A13 13 weeks gestation of pregnancy: Secondary | ICD-10-CM

## 2017-06-07 ENCOUNTER — Encounter (HOSPITAL_COMMUNITY): Payer: Self-pay | Admitting: *Deleted

## 2017-06-11 ENCOUNTER — Ambulatory Visit (HOSPITAL_COMMUNITY)
Admission: RE | Admit: 2017-06-11 | Discharge: 2017-06-11 | Disposition: A | Discharge status: Home / Self Care | Payer: Medicaid Other | Source: Ambulatory Visit | Attending: Nurse Practitioner | Admitting: Nurse Practitioner

## 2017-06-11 ENCOUNTER — Encounter (HOSPITAL_COMMUNITY): Payer: Self-pay

## 2017-06-11 ENCOUNTER — Other Ambulatory Visit (HOSPITAL_COMMUNITY): Payer: Self-pay | Admitting: *Deleted

## 2017-06-11 DIAGNOSIS — Z8279 Family history of other congenital malformations, deformations and chromosomal abnormalities: Secondary | ICD-10-CM | POA: Insufficient documentation

## 2017-06-11 DIAGNOSIS — O352XX0 Maternal care for (suspected) hereditary disease in fetus, not applicable or unspecified: Secondary | ICD-10-CM | POA: Insufficient documentation

## 2017-06-11 DIAGNOSIS — Z3A13 13 weeks gestation of pregnancy: Secondary | ICD-10-CM | POA: Insufficient documentation

## 2017-06-11 DIAGNOSIS — Z3682 Encounter for antenatal screening for nuchal translucency: Secondary | ICD-10-CM | POA: Insufficient documentation

## 2017-06-11 DIAGNOSIS — O09299 Supervision of pregnancy with other poor reproductive or obstetric history, unspecified trimester: Secondary | ICD-10-CM

## 2017-06-11 HISTORY — DX: Other specified health status: Z78.9

## 2017-06-11 NOTE — Progress Notes (Signed)
Genetic Counseling  High-Risk Gestation Note  Appointment Date:  06/11/2017 Referred By: Ilona Sorrel, NP Date of Birth:  1986-03-26 Partner: Felicity Coyer   Pregnancy History: [id number] Estimated Date of Delivery: 12/14/17 Estimated Gestational Age: 2w3dAttending: [place] MD   I met with Kari Shaw genetic counseling because of a previous child with a congenital heart defect. Stratus video Spanish/English interpreter, [place]([phone] provided interpretation for today's visit.   In summary:  Discussed family history of congenital heart defect for previous daughter  [facility] age 29 [facility] Daughter currently age [age]years old and otherwise healthy  Reviewed congenital heart defects prevalence of approximately 1%  Can occur isolated or syndromic  When isolated, multifactorial inheritance most often suspected  Recurrence risk for current pregnancy approximately 2-3%, in the case of multifactorial inheritance  Discussed options of screening / testing  Ultrasound- NT performed today; Detailed ultrasound scheduled 07/16/17  Fetal Echocardiogram- Will facilitate in the second trimester  Discussed general population carrier screening options  CF- pending through OB provider  SMA- declined  Hemoglobinopathies- pending [facility]  We began by reviewing the family history in detail. The first child for Kari. STarquinioand her husband was diagnosed postnatally with a hole in the heart. The patient reported that her daughter [facility] age 29 [facility] Her daughter is currently 99years old and reportedly healthy. She now sees cardiology every 4 years for follow-up. She is not anticipated to need additional correction. No underlying etiology was identified for her congenital heart defect. The couple's second child is currently 678years old, and she is  reportedly healthy.   We reviewed that congenital heart defects can be isolated or a feature of an underlying genetic condition. Congenital heart defects are most often multifactorial in etiology, but can also result from chromosome aberrations, single gene conditions, or teratogenic exposures. We discussed that isolated, nonsyndromic CHDs occur in approximately 1% of the general population. The report of her daughter's CHD is not suggestive of a particular genetic syndrome, and Kari. SVanderpoolreported no known teratogenic exposures during her pregnancy with her daughter. We discussed that in the case of isolated CHD and assuming multifactorial inheritance, recurrence risk for congenital heart disease in the current pregnancy is approximately 2-3%. If however, her daughter has an underlying genetic condition that caused the CHD, the risk of recurrence could be increased. In this case, the specific chance of recurrence depends on the inheritance of the condition. Without further information, an accurate risk assessment cannot be provided. We discussed the availability of a detailed anatomy ultrasound and fetal echocardiogram in the second trimester to assess the development of the heart during the pregnancy. Detailed ultrasound is scheduled for 07/16/17. Fetal echocardiogram will be facilitated in the second trimester for the patient.   The family histories were otherwise found to be noncontributory for birth defects, intellectual disability, and known genetic conditions. Consanguinity was denied. Without further information regarding the provided family history, an accurate genetic risk cannot be calculated. Further genetic counseling is warranted if more information is obtained.  We reviewed available screening and diagnostic options.  Regarding screening tests, we discussed the options of First screen, Quad screen and ultrasound.  She understands that screening tests are used to modify a patient's a priori risk  for aneuploidy, typically based on age. Given the patient's age alone, the current pregnancy is considered to  have a low risk for fetal aneuploidy. This estimate provides a pregnancy specific risk assessment.  We discussed the risks, limitations, and benefits of each. We discussed the possible results that the tests might provide including: positive, negative, unanticipated, and no result. Finally, they were counseled regarding the cost of each option and potential out of pocket expenses. Kari. Haldeman elected to pursue first trimester screening today. Complete ultrasound report from today's nuchal translucency assessment is under separate cover.   She understands that ultrasound cannot rule out all birth defects or genetic syndromes.   Kari Shaw was provided with written information regarding cystic fibrosis (CF), spinal muscular atrophy (SMA) and hemoglobinopathies including the carrier frequency, availability of carrier screening and prenatal diagnosis if indicated.  In addition, we discussed that CF and hemoglobinopathies are routinely screened for as part of the [place] newborn screening panel.  She declined screening for SMA. Carrier screening for CF and hemoglobin variants were drawn through her OB provider and are currently pending.   Kari Shaw denied exposure to environmental toxins or chemical agents. She denied the use of alcohol, tobacco or street drugs. She denied significant viral illnesses during the course of her pregnancy. Her medical and surgical histories were noncontributory.   I counseled Kari Shaw regarding the above risks and available options.  The approximate face-to-face time with the genetic counselor was 40 minutes.  Chipper Oman, Kari Certified Genetic Counselor 06/11/2017

## 2017-06-28 ENCOUNTER — Other Ambulatory Visit: Payer: Self-pay

## 2017-07-13 ENCOUNTER — Encounter (HOSPITAL_COMMUNITY): Payer: Self-pay | Admitting: *Deleted

## 2017-07-13 ENCOUNTER — Inpatient Hospital Stay (HOSPITAL_COMMUNITY)
Admission: AD | Admit: 2017-07-13 | Discharge: 2017-07-14 | Disposition: A | Discharge status: Home / Self Care | Payer: Self-pay | Source: Ambulatory Visit | Attending: Obstetrics & Gynecology | Admitting: Obstetrics & Gynecology

## 2017-07-13 DIAGNOSIS — O352XX0 Maternal care for (suspected) hereditary disease in fetus, not applicable or unspecified: Secondary | ICD-10-CM

## 2017-07-13 DIAGNOSIS — O2312 Infections of bladder in pregnancy, second trimester: Secondary | ICD-10-CM | POA: Insufficient documentation

## 2017-07-13 DIAGNOSIS — N3001 Acute cystitis with hematuria: Secondary | ICD-10-CM | POA: Insufficient documentation

## 2017-07-13 DIAGNOSIS — Z3A18 18 weeks gestation of pregnancy: Secondary | ICD-10-CM | POA: Insufficient documentation

## 2017-07-13 DIAGNOSIS — N200 Calculus of kidney: Secondary | ICD-10-CM | POA: Insufficient documentation

## 2017-07-13 DIAGNOSIS — O26832 Pregnancy related renal disease, second trimester: Secondary | ICD-10-CM | POA: Insufficient documentation

## 2017-07-13 LAB — URINALYSIS, ROUTINE W REFLEX MICROSCOPIC
Bilirubin Urine: NEGATIVE
GLUCOSE, UA: 50 mg/dL — AB
Ketones, ur: NEGATIVE mg/dL
NITRITE: NEGATIVE
PROTEIN: NEGATIVE mg/dL
SPECIFIC GRAVITY, URINE: 1.004 — AB (ref 1.005–1.030)
pH: 7 (ref 5.0–8.0)

## 2017-07-13 NOTE — MAU Note (Signed)
PT SAYS  PAIN IS  RIGHT BACK AND RIGHT LOWER ABD - STARTED AT 0600.    7PM - WORSE  - NO VOMITING .  NO MEDS  FOR PAIN.     PNC - WITH HD

## 2017-07-14 ENCOUNTER — Encounter (HOSPITAL_COMMUNITY): Payer: Self-pay | Admitting: *Deleted

## 2017-07-14 ENCOUNTER — Inpatient Hospital Stay (HOSPITAL_COMMUNITY): Payer: Self-pay

## 2017-07-14 DIAGNOSIS — O9989 Other specified diseases and conditions complicating pregnancy, childbirth and the puerperium: Secondary | ICD-10-CM

## 2017-07-14 DIAGNOSIS — Z3A18 18 weeks gestation of pregnancy: Secondary | ICD-10-CM

## 2017-07-14 DIAGNOSIS — N200 Calculus of kidney: Secondary | ICD-10-CM

## 2017-07-14 LAB — COMPREHENSIVE METABOLIC PANEL
ALBUMIN: 3.5 g/dL (ref 3.5–5.0)
ALT: 21 U/L (ref 14–54)
ANION GAP: 8 (ref 5–15)
AST: 21 U/L (ref 15–41)
Alkaline Phosphatase: 37 U/L — ABNORMAL LOW (ref 38–126)
BUN: 8 mg/dL (ref 6–20)
CO2: 23 mmol/L (ref 22–32)
Calcium: 8.9 mg/dL (ref 8.9–10.3)
Chloride: 105 mmol/L (ref 101–111)
Creatinine, Ser: 0.6 mg/dL (ref 0.44–1.00)
GFR calc Af Amer: >60 mL/min (ref >60)
GFR calc non Af Amer: >60 mL/min (ref >60)
GLUCOSE: 97 mg/dL (ref 65–99)
POTASSIUM: 3.5 mmol/L (ref 3.5–5.1)
SODIUM: 136 mmol/L (ref 135–145)
Total Bilirubin: 0.9 mg/dL (ref 0.3–1.2)
Total Protein: 6.8 g/dL (ref 6.5–8.1)

## 2017-07-14 LAB — CBC
HEMATOCRIT: 31.3 % — AB (ref 36.0–46.0)
Hemoglobin: 11 g/dL — ABNORMAL LOW (ref 12.0–15.0)
MCH: 31.3 pg (ref 26.0–34.0)
MCHC: 35.1 g/dL (ref 30.0–36.0)
MCV: 88.9 fL (ref 78.0–100.0)
Platelets: 255 10*3/uL (ref 150–400)
RBC: 3.52 MIL/uL — ABNORMAL LOW (ref 3.87–5.11)
RDW: 13.4 % (ref 11.5–15.5)
WBC: 13.5 10*3/uL — ABNORMAL HIGH (ref 4.0–10.5)

## 2017-07-14 MED ORDER — OXYCODONE-ACETAMINOPHEN 5-325 MG PO TABS: 1.0000 | ORAL_TABLET | ORAL | 0 refills | Status: DC | PRN

## 2017-07-14 MED ORDER — SODIUM CHLORIDE 0.9 % IV BOLUS (SEPSIS)
1000.0000 mL | Freq: Once | INTRAVENOUS | Status: AC
  Administered 2017-07-14: 1000 mL via INTRAVENOUS

## 2017-07-14 MED ORDER — TAMSULOSIN HCL 0.4 MG PO CAPS: 0.4000 mg | ORAL_CAPSULE | Freq: Every day | ORAL | 0 refills | Status: DC

## 2017-07-14 MED ORDER — DEXTROSE 5 % IV SOLN
2.0000 g | Freq: Once | INTRAVENOUS | Status: AC
  Administered 2017-07-14: 2 g via INTRAVENOUS
  Filled 2017-07-14: qty 2

## 2017-07-14 MED ORDER — NITROFURANTOIN MONOHYD MACRO 100 MG PO CAPS: 100.0000 mg | ORAL_CAPSULE | Freq: Two times a day (BID) | ORAL | 0 refills | Status: DC

## 2017-07-14 MED ORDER — PROMETHAZINE HCL 25 MG PO TABS: 12.5000 mg | ORAL_TABLET | Freq: Four times a day (QID) | ORAL | 0 refills | Status: DC | PRN

## 2017-07-14 MED ORDER — ACETAMINOPHEN 500 MG PO TABS
1000.0000 mg | ORAL_TABLET | Freq: Once | ORAL | Status: AC
  Administered 2017-07-14: 1000 mg via ORAL
  Filled 2017-07-14: qty 2

## 2017-07-14 NOTE — MAU Provider Note (Signed)
History     CSN: [id number]  Arrival date and time: 07/13/17 2300   First Provider Initiated Contact with Patient 07/14/17 0006      Chief Complaint  Patient presents with  . Back Pain   Kari Shaw is a 31 y.o. G3P2002 at 4084w1d who presents today with right sided flank pain that radiates to the front.    Back Pain  This is a new problem. The problem occurs constantly. The problem has been gradually worsening since onset. Pain location: flank. The quality of the pain is described as aching. Radiates to: to the front of the abdomen.  The pain is at a severity of 10/10. Associated symptoms include headaches. Pertinent negatives include no dysuria or fever (no measured temp, but my head feels hot. ). Risk factors include pregnancy. She has tried nothing for the symptoms.   Past Medical History:  Diagnosis Date  . Medical history non-contributory     Past Surgical History:  Procedure Laterality Date  . childbirth    . NO PAST SURGERIES      Family History  Problem Relation Age of Onset  . Heart defect Daughter        surgery age 133 months old    Social History  Substance Use Topics  . Smoking status: Never Smoker  . Smokeless tobacco: Never Used  . Alcohol use No    Allergies:  Allergies  Allergen Reactions  . Latex Itching and Other (See Comments)    Gloves    Prescriptions Prior to Admission  Medication Sig Dispense Refill Last Dose  . Prenatal Vit w/Fe-Methylfol-FA (PNV PO) Take by mouth.   07/12/2017 at Unknown time  . acetaminophen (TYLENOL) 325 MG tablet Take 650 mg by mouth every 6 (six) hours as needed for pain.   Not Taking  . acyclovir (ZOVIRAX) 400 MG tablet 1 tablet every 4 hours 5 times daily (Patient not taking: Reported on 06/11/2017) 50 tablet 0 Not Taking  . Alum & Mag Hydroxide-Simeth (MAGIC MOUTHWASH W/LIDOCAINE) SOLN Take 5 mLs by mouth 4 (four) times daily as needed for mouth pain. (Patient not taking: Reported on 06/11/2017) 120 mL 0 Not Taking   . ciprofloxacin (CIPRO) 500 MG tablet Take 1 tablet (500 mg total) by mouth 2 (two) times daily. (Patient not taking: Reported on 06/11/2017) 6 tablet 0 Not Taking  . levonorgestrel (MIRENA) 20 MCG/24HR IUD 1 each by Intrauterine route once.   Not Taking  . metroNIDAZOLE (FLAGYL) 500 MG tablet Take 1 tablet (500 mg total) by mouth 2 (two) times daily. (Patient not taking: Reported on 06/11/2017) 14 tablet 0 Not Taking    Review of Systems  Constitutional: Negative for chills and fever (no measured temp, but my head feels hot. ).  Gastrointestinal: Positive for nausea. Negative for vomiting.  Genitourinary: Positive for flank pain. Negative for dysuria, vaginal bleeding and vaginal discharge.  Musculoskeletal: Positive for back pain.  Neurological: Positive for headaches.   Physical Exam   Blood pressure 119/74, pulse (!) 108, temperature 99.4 F (37.4 C), resp. rate 18, height 5' (1.524 m), weight 139 lb (63 kg), last menstrual period 03/09/2017.  Physical Exam  Nursing note and vitals reviewed. Constitutional: She is oriented to person, place, and time. She appears well-developed and well-nourished. No distress.  HENT:  Head: Normocephalic.  Cardiovascular: Normal rate.   Respiratory: Effort normal.  GI: Soft. There is no tenderness. There is no rebound.  Genitourinary:  Genitourinary Comments: No CVA tenderness  FHT 160 with  doppler   Neurological: She is alert and oriented to person, place, and time.  Skin: Skin is warm and dry.  Psychiatric: She has a normal mood and affect.    Results for orders placed or performed during the hospital encounter of 07/13/17 (from the past 24 hour(s))  Urinalysis, Routine w reflex microscopic     Status: Abnormal   Collection Time: 07/13/17 11:30 PM  Result Value Ref Range   Color, Urine STRAW (A) YELLOW   APPearance HAZY (A) CLEAR   Specific Gravity, Urine 1.004 (L) 1.005 - 1.030   pH 7.0 5.0 - 8.0   Glucose, UA 50 (A) NEGATIVE mg/dL    Hgb urine dipstick LARGE (A) NEGATIVE   Bilirubin Urine NEGATIVE NEGATIVE   Ketones, ur NEGATIVE NEGATIVE mg/dL   Protein, ur NEGATIVE NEGATIVE mg/dL   Nitrite NEGATIVE NEGATIVE   Leukocytes, UA LARGE (A) NEGATIVE   RBC / HPF 0-5 0 - 5 RBC/hpf   WBC, UA TOO NUMEROUS TO COUNT 0 - 5 WBC/hpf   Bacteria, UA MANY (A) NONE SEEN   Squamous Epithelial / LPF 0-5 (A) NONE SEEN  CBC     Status: Abnormal   Collection Time: 07/14/17 12:25 AM  Result Value Ref Range   WBC 13.5 (H) 4.0 - 10.5 K/uL   RBC 3.52 (L) 3.87 - 5.11 MIL/uL   Hemoglobin 11.0 (L) 12.0 - 15.0 g/dL   HCT 16.1 (L) 09.6 - 04.5 %   MCV 88.9 78.0 - 100.0 fL   MCH 31.3 26.0 - 34.0 pg   MCHC 35.1 30.0 - 36.0 g/dL   RDW 40.9 81.1 - 91.4 %   Platelets 255 150 - 400 K/uL  Comprehensive metabolic panel     Status: Abnormal   Collection Time: 07/14/17 12:25 AM  Result Value Ref Range   Sodium 136 135 - 145 mmol/L   Potassium 3.5 3.5 - 5.1 mmol/L   Chloride 105 101 - 111 mmol/L   CO2 23 22 - 32 mmol/L   Glucose, Bld 97 65 - 99 mg/dL   BUN 8 6 - 20 mg/dL   Creatinine, Ser 7.82 0.44 - 1.00 mg/dL   Calcium 8.9 8.9 - 95.6 mg/dL   Total Protein 6.8 6.5 - 8.1 g/dL   Albumin 3.5 3.5 - 5.0 g/dL   AST 21 15 - 41 U/L   ALT 21 14 - 54 U/L   Alkaline Phosphatase 37 (L) 38 - 126 U/L   Total Bilirubin 0.9 0.3 - 1.2 mg/dL   GFR calc non Af Amer >60 >60 mL/min   GFR calc Af Amer >60 >60 mL/min   Anion gap 8 5 - 15   US Renal  Result Date: 07/14/2017 CLINICAL DATA:  Flank pain and hematuria.  Eighteen weeks pregnant. EXAM: RENAL / URINARY TRACT ULTRASOUND COMPLETE COMPARISON:  None. FINDINGS: Right Kidney: Length: 12.0 cm. Echogenicity within normal limits. No mass or hydronephrosis visualized. Left Kidney: Length: 10.3 cm. Normal parenchymal thickness and echogenicity. No hydronephrosis. Probable 5 x 7 mm lower pole collecting system calculus, nonobstructing. Bladder: Grossly unremarkable.  Both ureteral jets are visible. IMPRESSION: No  hydronephrosis. Probable lower pole left nephrolithiasis, nonobstructing. Both ureteral jets are visible at the urinary bladder. Electronically Signed   By: Ellery Plunk M.D.   On: 07/14/2017 01:35   MAU Course  Procedures  MDM Patient has had 1L bolus, and 2g rocephin IV here. She as had tylenol. She reports feeling better, and is resting comfortably.  DW patient about the  probable stone noted on [place]. Will dc home with pain medication and strainer to see if she can pass at home.    Assessment and Plan   1. Nephrolithiasis   2. Previous child with congenital anomaly, currently pregnant, antepartum, single or unspecified fetus   3. Acute cystitis with hematuria   4. [redacted] weeks gestation of pregnancy    DC home Comfort measures reviewed  2nd Trimester precautions  RX: flomax QD #30, Phenergan PRN #30, Percocet PRN #15, Macrobid BID x 5 days  Return to MAU as needed FU with OB as planned  Follow-up Information    [facility] Follow [facility].   Specialty:  Obstetrics and Gynecology Contact information: [address] [place] [address] [phone]           Thressa Sheller 07/14/2017, 12:08 AM

## 2017-07-14 NOTE — Discharge Instructions (Signed)
Clculos renales  (Kidney Stones)  Los clculos renales (urolitiasis) son depsitos slidos parecidos a una piedra que se forman dentro de los rganos que producen la orina (riones). Un clculo renal puede formarse en un rin y desplazarse a la vejiga, donde puede causar dolor intenso y obstruir el flujo de orina. Los clculos renales se forman cuando hay niveles altos de ciertos minerales presentes en la orina. Suelen eliminarse a travs de la orina, pero, en algunos casos, se necesita tratamiento mdico para eliminarlos.  CAUSAS  Los clculos renales pueden ser causados por lo siguiente:   Una afeccin en la cual ciertas glndulas producen mucha hormona paratiroidea (hiperparatiroidismo primario), lo que causa demasiada acumulacin de calcio en la sangre.   La acumulacin de cristales de cido rico en la vejiga (hiperuricuria). El cido rico es un qumico que el cuerpo produce cuando se ingieren determinados alimentos. Suele eliminarse a travs de la orina.   El estrechamiento (estenosis) de los conductos que drenan orina de los riones a la vejiga (urteres).   Una obstruccin en el rin presente al nacer (obstruccin congnita).   Una ciruga realizada en el rin o los urteres, como una ciruga de revascularizacin.  FACTORES DE RIESGO  Los siguientes factores pueden hacer que usted sea propenso a formar clculos renales:   Haber tenido un clculo renal en el pasado.   Tener antecedentes familiares de clculos renales.   No beber suficiente agua.   Seguir una dieta rica en protenas, sal (sodio) o azcar.   Tener exceso de peso u obesidad.  SNTOMAS  Los sntomas de clculos renales pueden incluir los siguientes:   Nuseas.   Vmitos.   Sangre en la orina (hematuria).   Dolor en el costado del abdomen, justo debajo de las costillas (dolor en fosa lumbar). Dolor que generalmente se expande (irradia) hacia la ingle.   Necesidad de orinar con frecuencia o urgencia.  DIAGNSTICO  Esta afeccin  se puede diagnosticar en funcin de lo siguiente:   Sus antecedentes mdicos.   Un examen fsico.   Anlisis de sangre.   Anlisis de orina.   Tomografa computarizada (TC).   Radiografa abdominal.   Una intervencin para examinar el interior de la vejiga (cistoscopia).  TRATAMIENTO  El tratamiento para los clculos renales depende del tamao, la ubicacin y la composicin de los clculos. El tratamiento puede incluir lo siguiente:   Analizar la orina antes y despus de eliminar el clculo a travs de la orina.   Supervisin en el hospital hasta eliminar el clculo a travs de la orina.   Aumentar la ingesta de lquidos y disminuir la cantidad de calcio y protenas en la dieta.   Una intervencin para romper los clculos en la vejiga utilizando lo siguiente:  ? Un haz de luz concentrado (terapia lser).  ? Ondas de choque (litotricia extracorprea).   Ciruga para extraer los clculos renales. Esto ser necesario si siente dolor intenso o los clculos obstruyen las vas urinarias.  INSTRUCCIONES PARA EL CUIDADO EN EL HOGAR  Comida y bebida   Beba suficiente lquido para mantener la orina clara o de color amarillo plido. Esto lo ayudar a eliminar el clculo renal.   Si se lo indican, modifique la dieta. Esto puede incluir lo siguiente:  ? Limitar la ingesta de sodio.  ? Comer ms frutas y verduras.  ? Limitar la ingesta de carne de res, carne de aves de corral, pescado y huevos.   Siga las indicaciones del mdico respecto de las restricciones   para las comidas o las bebidas.  Instrucciones generales   Recoja una muestra de orina como se lo haya indicado el mdico. Deber recoger una muestra de orina:  ? 24horas despus de eliminar el clculo.  ? Entre 8 y 12semanas despus de haber eliminado el clculo, y cada 6 a 12meses despus de haberlo eliminado.   Cuele la orina cada vez que orine segn las indicaciones del mdico. Use el colador que el mdico le haya recomendado.   No deseche el clculo  renal despus de haberlo eliminado. Consrvelo para que el mdico pueda analizarlo. Analizar la composicin del clculo renal puede evitar la formacin de posibles clculos renales en el futuro.   Tome los medicamentos de venta libre y los recetados solamente como se lo haya indicado el mdico.   Concurra a todas las visitas de control como se lo haya indicado el mdico. Esto es importante. Es posible que le realicen radiografas o ecografas para asegurarse de que haya eliminado el clculo.  PREVENCIN  Para prevenir otro clculo renal:   Beba suficiente lquido para mantener la orina clara o de color amarillo plido. Esta es la mejor manera de prevenir la formacin de clculos renales.   Siga una dieta saludable y las recomendaciones de su mdico respecto de los alimentos que debe evitar. Es posible que le indiquen que siga una dieta baja en protenas. Las recomendaciones varan segn el tipo de clculo renal que tenga.   Mantenga un peso saludable.  SOLICITE ATENCIN MDICA SI:   Tiene un dolor que empeora o que no mejora con los medicamentos.    SOLICITE ATENCIN MDICA DE INMEDIATO SI:   Tiene fiebre o siente escalofros.   Siente dolor intenso.   Siente dolor abdominal.   Se desmaya.   No puede orinar.    Esta informacin no tiene como fin reemplazar el consejo del mdico. Asegrese de hacerle al mdico cualquier pregunta que tenga.  Document Released: 10/29/2005 Document Revised: 07/20/2015 Document Reviewed: 04/13/2016  Elsevier Interactive Patient Education  2017 Elsevier Inc.

## 2017-07-16 ENCOUNTER — Encounter (HOSPITAL_COMMUNITY): Payer: Self-pay

## 2017-07-16 ENCOUNTER — Ambulatory Visit (HOSPITAL_COMMUNITY)
Admission: RE | Admit: 2017-07-16 | Discharge: 2017-07-16 | Disposition: A | Discharge status: Home / Self Care | Payer: Self-pay | Source: Ambulatory Visit | Attending: Nurse Practitioner | Admitting: Nurse Practitioner

## 2017-07-16 ENCOUNTER — Other Ambulatory Visit (HOSPITAL_COMMUNITY): Payer: Self-pay | Admitting: Obstetrics and Gynecology

## 2017-07-16 DIAGNOSIS — Z363 Encounter for antenatal screening for malformations: Secondary | ICD-10-CM

## 2017-07-16 DIAGNOSIS — O09299 Supervision of pregnancy with other poor reproductive or obstetric history, unspecified trimester: Secondary | ICD-10-CM

## 2017-07-16 DIAGNOSIS — Z3A18 18 weeks gestation of pregnancy: Secondary | ICD-10-CM

## 2017-07-16 DIAGNOSIS — Z8279 Family history of other congenital malformations, deformations and chromosomal abnormalities: Secondary | ICD-10-CM | POA: Insufficient documentation

## 2017-07-16 LAB — CULTURE, OB URINE

## 2017-09-04 ENCOUNTER — Encounter (HOSPITAL_COMMUNITY): Payer: Self-pay

## 2017-09-24 LAB — OB RESULTS CONSOLE RPR: RPR: NONREACTIVE

## 2017-09-24 LAB — OB RESULTS CONSOLE HGB/HCT, BLOOD
HCT: 35
HEMOGLOBIN: 11.3

## 2017-09-24 LAB — OB RESULTS CONSOLE HIV ANTIBODY (ROUTINE TESTING): HIV: NONREACTIVE

## 2017-11-01 ENCOUNTER — Encounter (HOSPITAL_COMMUNITY): Payer: Self-pay | Admitting: *Deleted

## 2017-11-01 ENCOUNTER — Other Ambulatory Visit: Payer: Self-pay

## 2017-11-01 ENCOUNTER — Inpatient Hospital Stay (HOSPITAL_COMMUNITY)
Admission: AD | Admit: 2017-11-01 | Discharge: 2017-11-01 | Disposition: A | Discharge status: Home / Self Care | Payer: Self-pay | Source: Ambulatory Visit | Attending: Obstetrics & Gynecology | Admitting: Obstetrics & Gynecology

## 2017-11-01 DIAGNOSIS — O2313 Infections of bladder in pregnancy, third trimester: Secondary | ICD-10-CM | POA: Insufficient documentation

## 2017-11-01 DIAGNOSIS — N3000 Acute cystitis without hematuria: Secondary | ICD-10-CM

## 2017-11-01 DIAGNOSIS — M549 Dorsalgia, unspecified: Secondary | ICD-10-CM | POA: Insufficient documentation

## 2017-11-01 DIAGNOSIS — O26893 Other specified pregnancy related conditions, third trimester: Secondary | ICD-10-CM | POA: Insufficient documentation

## 2017-11-01 DIAGNOSIS — Z3A33 33 weeks gestation of pregnancy: Secondary | ICD-10-CM | POA: Insufficient documentation

## 2017-11-01 DIAGNOSIS — Z79899 Other long term (current) drug therapy: Secondary | ICD-10-CM | POA: Insufficient documentation

## 2017-11-01 DIAGNOSIS — O352XX0 Maternal care for (suspected) hereditary disease in fetus, not applicable or unspecified: Secondary | ICD-10-CM

## 2017-11-01 DIAGNOSIS — O26613 Liver and biliary tract disorders in pregnancy, third trimester: Secondary | ICD-10-CM | POA: Insufficient documentation

## 2017-11-01 LAB — COMPREHENSIVE METABOLIC PANEL
ALBUMIN: 2.9 g/dL — AB (ref 3.5–5.0)
ALK PHOS: 104 U/L (ref 38–126)
ALT: 18 U/L (ref 14–54)
ANION GAP: 9 (ref 5–15)
AST: 20 U/L (ref 15–41)
BILIRUBIN TOTAL: 1 mg/dL (ref 0.3–1.2)
BUN: 9 mg/dL (ref 6–20)
CALCIUM: 8.8 mg/dL — AB (ref 8.9–10.3)
CO2: 21 mmol/L — ABNORMAL LOW (ref 22–32)
CREATININE: 0.45 mg/dL (ref 0.44–1.00)
Chloride: 106 mmol/L (ref 101–111)
GFR calc Af Amer: >60 mL/min (ref >60)
GFR calc non Af Amer: >60 mL/min (ref >60)
GLUCOSE: 108 mg/dL — AB (ref 65–99)
Potassium: 3.7 mmol/L (ref 3.5–5.1)
Sodium: 136 mmol/L (ref 135–145)
TOTAL PROTEIN: 6.1 g/dL — AB (ref 6.5–8.1)

## 2017-11-01 LAB — URINALYSIS, ROUTINE W REFLEX MICROSCOPIC
BILIRUBIN URINE: NEGATIVE
GLUCOSE, UA: NEGATIVE mg/dL
HGB URINE DIPSTICK: NEGATIVE
KETONES UR: NEGATIVE mg/dL
NITRITE: POSITIVE — AB
PH: 6 (ref 5.0–8.0)
Protein, ur: NEGATIVE mg/dL
Specific Gravity, Urine: 1.01 (ref 1.005–1.030)

## 2017-11-01 LAB — WET PREP, GENITAL
CLUE CELLS WET PREP: NONE SEEN
Sperm: NONE SEEN
Trich, Wet Prep: NONE SEEN
Yeast Wet Prep HPF POC: NONE SEEN

## 2017-11-01 LAB — CBC
HEMATOCRIT: 32.5 % — AB (ref 36.0–46.0)
HEMOGLOBIN: 11.4 g/dL — AB (ref 12.0–15.0)
MCH: 31.3 pg (ref 26.0–34.0)
MCHC: 35.1 g/dL (ref 30.0–36.0)
MCV: 89.3 fL (ref 78.0–100.0)
Platelets: 268 10*3/uL (ref 150–400)
RBC: 3.64 MIL/uL — ABNORMAL LOW (ref 3.87–5.11)
RDW: 12.7 % (ref 11.5–15.5)
WBC: 7.1 10*3/uL (ref 4.0–10.5)

## 2017-11-01 MED ORDER — CYCLOBENZAPRINE HCL 10 MG PO TABS
10.0000 mg | ORAL_TABLET | Freq: Once | ORAL | Status: AC
  Administered 2017-11-01: 10 mg via ORAL
  Filled 2017-11-01: qty 1

## 2017-11-01 MED ORDER — CEPHALEXIN 500 MG PO CAPS
500.0000 mg | ORAL_CAPSULE | Freq: Three times a day (TID) | ORAL | 0 refills | Status: DC
Start: 2017-11-01 — End: 2017-11-26

## 2017-11-01 NOTE — Discharge Instructions (Signed)
Embarazo e infección urinaria °(Pregnancy and Urinary Tract Infection) °¿QUÉ ES UNA INFECCIÓN URINARIA? °Una infección urinaria (IU) puede ocurrir en cualquier lugar de las vías urinarias. Estas incluyen los riñones, los tubos que conectan los riñones con la vejiga (uréteres), la vejiga y el tubo por el que se elimina la orina del cuerpo (uretra). Estos órganos fabrican, almacenan y eliminan la orina del organismo. La IU puede ser una infección de la vejiga (cistitis) o una infección de los riñones (pielonefritis). Esta infección puede deberse a hongos, virus o bacterias. Las bacterias son las causas más comunes de las IU. °Es más probable presentar una IU durante el embarazo por estas razones: °· Los cambios físicos y hormonales por los que atraviesa el cuerpo pueden hacer que sea más fácil que las bacterias ingresen en las vías urinarias. °· El feto en desarrollo hace presión sobre el útero y puede afectar el flujo de orina. °¿LA IU PONE EN RIESGO AL BEBÉ? °Una IU no tratada durante el embarazo podría ocasionar una infección en los riñones, lo que puede causar problemas de salud que afecten al bebé. Algunas de las complicaciones posibles de una IU no tratada son las siguientes: °· Tener al bebé antes de las 37 semanas de embarazo (prematuro). °· Tener un bebé con bajo peso al nacer. °· Presentar hipertensión arterial durante el embarazo (preeclampsia). °¿CUÁLES SON LOS SÍNTOMAS DE LA IU? °Entre los síntomas de una IU, se incluyen los siguientes: °· Fiebre. °· Micción frecuente o eliminación de pequeñas cantidades de orina con frecuencia. °· Necesidad urgente de orinar. °· Sensación de ardor o dolor al orinar. °· Orina con mal olor u olor atípico. °· Orina turbia. °· Dolor en la parte baja del abdomen o en la espalda. °· Dificultad para orinar. °· Sangre en la orina. °· Vómitos o más apetito de lo normal. °· Diarrea o dolor abdominal. °· Tiene secreción de flujo vaginal. °¿CUÁLES SON LAS OPCIONES DE TRATAMIENTO  PARA LA IU DURANTE EL EMBARAZO? °El tratamiento de esta afección puede incluir lo siguiente: °· Antibióticos cuyo uso es seguro durante el embarazo. °· Otros medicamentos para tratar las causas menos frecuentes de infección urinaria. °¿CÓMO PUEDO PREVENIR UNA IU? °Para prevenir la IU, haga lo siguiente: °· Vaya al baño en cuanto sienta la necesidad de hacerlo. °· Siempre debe limpiarse desde adelante hacia atrás. °· Lávese el área genital con agua tibia y jabón todos los días. °· Vaciar la vejiga antes y después de tener relaciones sexuales. °· Use ropa interior de algodón. °· Limite el consumo de alimentos y bebidas con alto contenido de azúcar, como gaseosas comunes, jugos y dulces. °· Beba de 6 a 8 vasos de agua por día. °· No use pantalones ajustados. °· No se haga duchas vaginales ni use desodorantes en aerosol. °· No tome alcohol, cafeína ni bebidas gaseosas. Estas sustancias pueden irritar la vejiga. °¿CUÁNDO DEBO BUSCAR ATENCIÓN MÉDICA? °Solicite atención médica si: °· Los síntomas no mejoran o empeoran. °· Tiene fiebre después de dos días de tratamiento. °· Tiene una erupción cutánea. °· Tiene flujo vaginal anormal. °· Siente dolor en la espalda o en el costado. °· Tiene escalofríos. °· Tiene náuseas y vómitos. °¿CUÁNDO DEBO BUSCAR ASISTENCIA MÉDICA INMEDIATA? °Solicite atención médica de inmediato si está embarazada y le sucede lo siguiente: °· Siente contracciones en el útero. °· Siente dolor en la parte inferior del abdomen. °· Tiene una pérdida de líquido por la vagina. °· Observa sangre en la orina. °· Tiene vómitos y no puede tragar medicamentos ni agua. °Esta   información no tiene como fin reemplazar el consejo del médico. Asegúrese de hacerle al médico cualquier pregunta que tenga. °Document Released: 07/23/2012 Document Revised: 02/20/2016 Document Reviewed: 09/19/2015 °Elsevier Interactive Patient Education © 2017 Elsevier Inc. ° °

## 2017-11-01 NOTE — MAU Provider Note (Signed)
History     CSN: [id number]  Arrival date [id number] time: 11/01/17 2025   First Provider Initiated Contact with Patient 11/01/17 2111      Chief Complaint  Patient presents with  . Back Pain   Kari Shaw is a 31 y.o. G3P2002 at 4580w6d (datedby LMP and 13 week US) who presents today with itching in her arms and hands since yesterday. She states that she had cholestasis with her prior pregnancy.  Worse at night. She is also having back pain. She reports that started this morning. She states that it is sharp pain, and does not feel like a contraction. She states that it is in her lower back, worse with movement, or getting up from a chair. She rates pain 7/10. She states that it comes and goes. She has not taken any medication for pain prior to arrival. She denies any VB or LOF. She has had mucous discharge. She reports normal fetal movement. [facility]. Next appt: 11/11/17.    Past Medical History:  Diagnosis Date  . Medical history non-contributory     Past Surgical History:  Procedure Laterality Date  . childbirth    . NO PAST SURGERIES      Family History  Problem Relation Age of Onset  . Heart defect Daughter        surgery age 763 months old    Social History   Tobacco Use  . Smoking status: Never Smoker  . Smokeless tobacco: Never Used  Substance Use Topics  . Alcohol use: No  . Drug use: No    Allergies:  Allergies  Allergen Reactions  . Latex Itching and Other (See Comments)    Gloves    Medications Prior to Admission  Medication Sig Dispense Refill Last Dose  . Prenatal Vit w/Fe-Methylfol-FA (PNV PO) Take by mouth.   11/01/2017 at Unknown time  . acetaminophen (TYLENOL) 325 MG tablet Take 650 mg by mouth every 6 (six) hours as needed for pain.   Unknown at Unknown time  . acyclovir (ZOVIRAX) 400 MG tablet 1 tablet every 4 hours 5 times daily (Patient not taking: Reported on 06/11/2017) 50 tablet 0 Not Taking  . Alum &  Mag Hydroxide-Simeth (MAGIC MOUTHWASH W/LIDOCAINE) SOLN Take 5 mLs by mouth 4 (four) times daily as needed for mouth pain. (Patient not taking: Reported on 06/11/2017) 120 mL 0 Not Taking  . nitrofurantoin, macrocrystal-monohydrate, (MACROBID) 100 MG capsule Take 1 capsule (100 mg total) by mouth 2 (two) times daily. 10 capsule 0 Taking  . oxyCODONE-acetaminophen (PERCOCET/ROXICET) 5-325 MG tablet Take 1-2 tablets by mouth every 4 (four) hours as needed for severe pain. 15 tablet 0 More than a month at Unknown time  . promethazine (PHENERGAN) 25 MG tablet Take 0.5-1 tablets (12.5-25 mg total) by mouth every 6 (six) hours as needed. (Patient not taking: Reported on 07/16/2017) 30 tablet 0 Not Taking  . tamsulosin (FLOMAX) 0.4 MG CAPS capsule Take 1 capsule (0.4 mg total) by mouth daily. 30 capsule 0 More than a month at Unknown time    Review of Systems  Constitutional: Negative for chills and fever.  Gastrointestinal: Negative for nausea and vomiting.  Genitourinary: Negative for dysuria, frequency, pelvic pain, urgency, vaginal bleeding and vaginal discharge.   Physical Exam   Blood pressure 118/70, pulse 85, temperature 97.6 F (36.4 C), resp. rate 18, height 5' 0.5" (1.537 m), weight 155 lb (70.3 kg), last menstrual period 03/09/2017.  Physical Exam  Nursing note and vitals reviewed. Constitutional: She is oriented to person, place, and time. She appears well-developed and well-nourished. No distress.  HENT:  Head: Normocephalic.  Cardiovascular: Normal rate.  Respiratory: Effort normal.  GI: Soft. There is no tenderness. There is no rebound.  Genitourinary:  Genitourinary Comments: No CVA tenderness External: no lesion Vagina: small amount of white discharge Cervix: pink, smooth, closed/thick/ballotable  Uterus: AGA  Neurological: She is alert and oriented to person, place, and time.  Skin: Skin is warm and dry.  Psychiatric: She has a normal mood and affect.   FHT: 145, moderate  with 15x15 accels, no decels Toco: rare UC.   Results for orders placed or performed during the hospital encounter of 11/01/17 (from the past 24 hour(s))  Urinalysis, Routine w reflex microscopic     Status: Abnormal   Collection Time: 11/01/17  8:45 PM  Result Value Ref Range   Color, Urine YELLOW YELLOW   APPearance HAZY (A) CLEAR   Specific Gravity, Urine 1.010 1.005 - 1.030   pH 6.0 5.0 - 8.0   Glucose, UA NEGATIVE NEGATIVE mg/dL   Hgb urine dipstick NEGATIVE NEGATIVE   Bilirubin Urine NEGATIVE NEGATIVE   Ketones, ur NEGATIVE NEGATIVE mg/dL   Protein, ur NEGATIVE NEGATIVE mg/dL   Nitrite POSITIVE (A) NEGATIVE   Leukocytes, UA MODERATE (A) NEGATIVE   RBC / HPF 0-5 0 - 5 RBC/hpf   WBC, UA 6-30 0 - 5 WBC/hpf   Bacteria, UA MANY (A) NONE SEEN   Squamous Epithelial / LPF 0-5 (A) NONE SEEN   Mucus PRESENT     MAU Course  Procedures  MDM CBC, CMET, Bile Acids and UC pending  Assessment and Plan   1. [redacted] weeks gestation of pregnancy   2. Previous child with congenital anomaly, currently pregnant, antepartum, single or unspecified fetus   3. Acute cystitis without hematuria    DC home Bile Acids pending Comfort measures reviewed  3rd Trimester precautions  PTL precautions  Fetal kick counts RX: keflex TID x 7 days #21 Return to MAU as needed FU with OB as planned  Follow-up Information    [facility].   Contact information: [address] [place] [address] [phone]            Thressa ShellerHeather Hogan 11/01/2017, 9:12 PM

## 2017-11-01 NOTE — MAU Note (Signed)
Back pain since this am. Was intermittent this am but now more constant. Some mucousy d/c.

## 2017-11-04 LAB — GC/CHLAMYDIA PROBE AMP (~~LOC~~) NOT AT ARMC
CHLAMYDIA, DNA PROBE: NEGATIVE
NEISSERIA GONORRHEA: NEGATIVE

## 2017-11-04 LAB — CULTURE, OB URINE

## 2017-11-04 LAB — BILE ACIDS, TOTAL: BILE ACIDS TOTAL: 10.7 umol/L (ref 4.7–24.5)

## 2017-11-11 LAB — OB RESULTS CONSOLE GBS: GBS: NEGATIVE

## 2017-11-12 NOTE — L&D Delivery Note (Signed)
Patient is 32 y.o. [id number] 4473w1d admitted for IOL for cholestasis.   Delivery Note At 2205, a viable female  was delivered via  SVD, Presentation: cephalic,LOA. Single loose nuchal reduced at perineumAPGAR: 8,9 ; weight pending.   Placenta status: spontaneous, intact. Cord: 3 vessel  Anesthesia:  epidural Episiotomy:  none Lacerations:  1st degree Suture Repair: none Est. Blood Loss (mL): 300  Mom to postpartum.  Baby to Couplet care / Skin to Skin.  Rolm BookbinderAmber Marena Witts, DO [facility]

## 2017-11-13 ENCOUNTER — Encounter: Payer: Self-pay | Admitting: Obstetrics and Gynecology

## 2017-11-13 ENCOUNTER — Encounter: Payer: Self-pay | Admitting: *Deleted

## 2017-11-18 ENCOUNTER — Telehealth (HOSPITAL_COMMUNITY): Payer: Self-pay | Admitting: *Deleted

## 2017-11-18 ENCOUNTER — Ambulatory Visit (INDEPENDENT_AMBULATORY_CARE_PROVIDER_SITE_OTHER): Payer: Self-pay | Admitting: Obstetrics and Gynecology

## 2017-11-18 ENCOUNTER — Encounter: Payer: Self-pay | Admitting: Obstetrics and Gynecology

## 2017-11-18 ENCOUNTER — Encounter (HOSPITAL_COMMUNITY): Payer: Self-pay | Admitting: *Deleted

## 2017-11-18 DIAGNOSIS — O0993 Supervision of high risk pregnancy, unspecified, third trimester: Secondary | ICD-10-CM | POA: Diagnosis present

## 2017-11-18 DIAGNOSIS — O0990 Supervision of high risk pregnancy, unspecified, unspecified trimester: Secondary | ICD-10-CM | POA: Insufficient documentation

## 2017-11-18 DIAGNOSIS — O26619 Liver and biliary tract disorders in pregnancy, unspecified trimester: Secondary | ICD-10-CM

## 2017-11-18 DIAGNOSIS — Z87442 Personal history of urinary calculi: Secondary | ICD-10-CM | POA: Diagnosis not present

## 2017-11-18 DIAGNOSIS — K831 Obstruction of bile duct: Secondary | ICD-10-CM | POA: Diagnosis not present

## 2017-11-18 DIAGNOSIS — Z113 Encounter for screening for infections with a predominantly sexual mode of transmission: Secondary | ICD-10-CM | POA: Diagnosis not present

## 2017-11-18 DIAGNOSIS — O26613 Liver and biliary tract disorders in pregnancy, third trimester: Secondary | ICD-10-CM

## 2017-11-18 LAB — GLUCOSE, 1 HOUR
GLUCOSE, 1 HOUR-GESTATIONAL: 89
Glucose Tolerance, 1 hour: 122

## 2017-11-18 LAB — DRUG SCREEN, URINE: Drug Screen, Urine: NEGATIVE

## 2017-11-18 LAB — AFP TETRA: AFP Quad Interp: NEGATIVE

## 2017-11-18 LAB — HM PAP SMEAR: PAP SMEAR: NEGATIVE

## 2017-11-18 LAB — CULTURE, OB URINE: URINE CULTURE, OB: POSITIVE

## 2017-11-18 LAB — ALPHA FETOPROTEIN, MATERNAL

## 2017-11-18 NOTE — Telephone Encounter (Signed)
Preadmission screen  

## 2017-11-18 NOTE — Progress Notes (Signed)
Spanish video interpreter "Viviana" # [phone]

## 2017-11-18 NOTE — Patient Instructions (Addendum)
Parto vaginal Vaginal Delivery Parto vaginal significa que usted dar a luz empujando al beb fuera del canal del parto (vagina). Un equipo de proveedores de atencin mdica la ayudar antes, durante y despus del parto vaginal. Las experiencias de los nacimientos son nicas para todas las mujeres y cada embarazo y las experiencias de nacimiento varan segn dnde elija dar a luz. Qu debo hacer a fin de prepararme para el nacimiento de mi beb? Antes del nacimiento de su beb, es importante que hable con su mdico sobre lo siguiente:  Sus preferencias en cuanto al trabajo de parto y parto. Estas pueden incluir lo siguiente: ? Medicamentos que le puedan administrar. ? Cmo controlar el dolor. Esto podra incluir tcnicas de alivio del dolor no mdicas o medicamentos inyectables para aliviar el dolor como la analgesia epidural. ? Cmo se los controlar a usted y a su beb durante el trabajo de parto y el parto. ? Quin puede estar en la sala de trabajo de parto y parto con usted. ? Sus opiniones en cuanto al parto quirrgico de su beb (parto por cesrea o cesrea) si esto fuera necesario. ? Sus opiniones en cuanto a recibir sangre donada a travs de un tubo (catter) intravenoso (transfusin de sangre) si esto fuera necesario.  Si usted puede: ? Tomar fotografas o videos del nacimiento. ? Comer durante el trabajo de parto y el parto. ? Moverse, caminar o cambiar de posicin durante el trabajo de parto y el parto.  Qu esperar despus del nacimiento de su beb, como: ? Si se ofrece el pinzamiento y corte tardo del cordn umbilical. ? Quin cuidar a su beb inmediatamente despus del nacimiento. ? Medicamentos o pruebas que pueden recomendarse para su beb. ? Si su hospital o centro de parto apoya la lactancia. ? Cunto tiempo estar en el hospital o en el centro de parto.  Cmo cualquier afeccin mdica que usted tenga puede afectar a su beb o la experiencia de trabajo de parto y  parto.  A fin de prepararse para el nacimiento de su beb, usted tambin debe:  Asistir a todas sus visitas de atencin mdica antes del parto (visitas prenatales) segn lo recomendado por su mdico. Esto es importante.  Preparacin del hogar para la llegada de su beb. Asegrese de tener lo siguiente: ? Paales. ? Ropa de beb. ? Equipo de alimentacin. ? Haga preparativos para que usted y su beb puedan dormir de manera segura.  Instale un asiento de beb en su vehculo. Haga verificar el asiento de beb de su coche por un instalador de asientos de beb para asegurarse de que est instalado en forma segura.  Piense en quin la ayudar con su nuevo beb en el hogar durante al menos las primeras semanas despus del parto.  Qu puedo esperar cuando llegue al centro de parto o el hospital? Una vez que se inicie el trabajo de parto y haya sido admitida en el hospital o centro de parto, el mdico podr hacer lo siguiente:  Revisar sus antecedentes de embarazo y cualquier inquietud que usted pueda tener.  Colocar un tubo (catter) intravenoso en una de sus venas. Esto se usa para administrarle lquidos y medicamentos.  Verificar su presin arterial, temperatura y pulso y la frecuencia cardaca (signos vitales).  Verificar si la bolsa de agua (saco amnitico) se ha roto (ruptura).  Hablar con usted sobre su plan de nacimiento y analizar las opciones para controlar el dolor.  Monitoreo Su mdico puede monitorear las contracciones (monitoreo uterino) y   el ritmo cardaco del beb (monitoreo fetal). Es posible que el monitoreo se necesite realizar:  Con frecuencia, pero no continuamente (intermitentemente).  Todo el tiempo o durante largos perodos a la vez (continuamente). El monitoreo continuo puede ser necesario si: ? Usted est recibiendo determinados medicamentos, tales como medicamentos para aliviar el dolor o para hacer que las contracciones ms fuertes. ? Tiene complicaciones  durante el embarazo o el trabajo de parto.  El monitoreo se puede realizar:  Al colocar un estetoscopio especial o un dispositivo manual de monitoreo en el abdomen o verificar los latidos cardacos de su beb y sentir su abdomen para controlar de contracciones. Este mtodo de monitoreo no registra los latidos cardacos de su beb ni sus contracciones de manera continua.  Colocar monitores en el abdomen (monitores externos) para registrar los latidos cardacos de su beb y la frecuencia y duracin de las contracciones. No tendr que tener colocados los monitores externos en todo momento.  Colocar monitores dentro del tero (monitores internos) para registrar los latidos cardacos de su beb y la frecuencia, duracin y fuerza de sus contracciones. ? Su mdico podr usar monitores internos si necesita ms informacin sobre la fuerza de las contracciones o la frecuencia cardaca del beb. ? Los monitores internos se colocan pasando un cable delgado y flexible a travs de la vagina hasta el tero. Segn el tipo de monitor, puede permanecer en el tero o en la cabeza del beb hasta el nacimiento. ? Su mdico analizar con usted los beneficios y los riesgos de usar un monitor interno y le pedir permiso antes de colocar los monitores.  Telemetra. Se trata de un tipo de monitoreo continuo que se puede realizar con monitores externos o internos. En lugar de tener que permanecer en la cama, usted puede moverse durante la telemetra. Pregunte a su mdico si la telemetra es una opcin para usted.  Examen fsico. Su mdico puede realizarle un examen fsico. Esto puede incluir lo siguiente:  Verificar en qu posicin se encuentra su beb: ? Con la cabeza hacia la vagina (cabeza abajo). Esta es la posicin ms frecuente. ? Con la cabeza hacia la parte superior del tero (cabeza arriba o de nalgas). Si su beb est en una posicin de nalgas, su mdico puede tratar de hacerlo girar para que quede cabeza abajo a  fin de poder tener un parto vaginal. Si parece que su beb no puede nacer con parto vaginal, su mdico puede recomendar una ciruga para dar a luz al beb. En casos muy poco frecuentes, usted puede dar a luz con parto vaginal si el beb est cabeza arriba (parto de nalgas). ? Posicin lateral (transversal). Los bebs que estn en posicin lateral no pueden nacer por parto vaginal.  Verificar el cuello uterino para determinar: ? Si se est afinando o estirando (borrando). ? Si se est abriendo (dilatando). ? Cunto se ha movido o ha bajado su beb por el canal del parto.  Cules son las tres etapas del trabajo de parto y el parto?  El trabajo de parto y el parto normales se dividen en tres etapas: Etapa 1  La etapa 1 es la etapa ms larga del trabajo de parto y puede durar horas o das. La etapa 1 incluye: ? Trabajo de parto temprano. Esto es cuando las contracciones pueden ser irregulares o regulares y leves. En general, las contracciones del trabajo de parto temprano se producen con ms de 10 minutos de diferencia. ? Trabajo de parto activo. Esto es cuando las   contracciones son ms largas, ms regulares, ms frecuentes y ms intensas. ? La fase de transicin. Esto es cuando las contracciones ocurren muy seguidas, son muy intensas y pueden durar ms que durante cualquier otra parte del trabajo de parto.  En general, las contracciones son leves, infrecuentes e irregulares al principio. Se hacen ms fuertes, ms frecuentes (aproximadamente cada 2 o 3 minutos) y ms regulares a medida que usted avanza desde un trabajo de parto temprano hasta un trabajo de parto activo y fase de transicin.  Muchas mujeres progresan a travs de la etapa 1 de forma natural, pero es posible que usted necesite ayuda para continuar avanzando. Si esto ocurre, su mdico puede hablar con usted sobre lo siguiente: ? La ruptura del saco amnitico si es que an no ha ocurrido. ? Administracin de medicamentos para ayudarla  a tener contracciones ms fuertes y ms frecuentes.  La etapa 1 finaliza cuando el cuello uterino est completamente dilatado hasta 4 pulgadas (10cm) y completamente borrado. Esto ocurre al final de la fase de transicin. Etapa 2  Una vez que el cuello uterino est totalmente borrado y dilatado a 4 pulgadas (10cm), usted puede comenzar a sentir ganas de pujar. Es comn que el cuerpo tome un descanso de manera natural antes de sentir ganas de pujar, especialmente si recibe una epidural u otros medicamentos para el dolor. Este perodo de descanso puede durar un mximo de 1 a 2 horas, segn su experiencia de parto nica.  Durante la etapa 2, las contracciones son generalmente menos doloras porque pujar ayuda a aliviar el dolor de las contracciones. En lugar del dolor de las contracciones, puede sentir un dolor urente y por estiramiento, especialmente cuando la parte ms ancha de la cabeza de su beb pasa a travs de la abertura vaginal (coronacin).  Su mdico controlar atentamente su avance con los pujos y el avance del beb a travs de la vagina durante la etapa 2.  Su mdico puede masajear el rea de la piel entre la abertura vaginal y el ano (perineo) o aplicar compresas tibias en el perineo. Esto ayuda al estiramiento ya que la cabeza del beb empieza a aparecer, lo cual puede ayudar a evitar un desgarro perineal. ? En algunos casos, se puede hacer una incisin en el peritoneo (episiotoma) para permitir que el beb pase a travs de la abertura vaginal. La episiotoma sirve para agrandar la abertura vaginal a fin de que el beb tenga ms espacio para pasar durante el parto.  Es muy importante respirar y concentrarse para que el mdico pueda controlar la salida de la cabeza del beb. Es posible que su mdico tenga que disminuir la intensidad de los pujos para ayudar a evitar un desgarro perineal.  Despus de que sale la cabeza del beb, en general salen los hombros y el resto del cuerpo muy  rpidamente y sin dificultad.  Una vez que el beb nace, se debe cortar el cordn umbilical de inmediato o esto puede demorar 1 o 2 minutos, segn la salud del beb. Este procedimiento puede variar segn el mdico, el hospital y el centro de parto.  Si usted y su beb estn lo suficientemente sanos, se le colocar el beb en el pecho o el abdomen para ayudar a mantener la temperatura del beb y el vnculo entre ustedes. Algunas madres y bebs comienzan la lactancia en este momento. Su equipo mdico secar al beb y lo ayudar a mantenerse caliente durante este tiempo.  Es posible que su beb necesite atencin   inmediata si: ? Mostr signos de sufrimiento durante el trabajo de [place]. ? Tiene una afeccin mdica. ? Naci antes de tiempo (prematuro). ? Defeca antes del nacimiento (meconio). ? Muestra signos de dificultar en la transicin de estar dentro del tero a estar fuera del tero. Si tiene Industrial/product designerplaneado amamantar, su equipo mdico la ayudar a Lawyercomenzar la lactancia. Etapa 3  La tercera etapa del trabajo de parto comienza inmediatamente despus del nacimiento de su beb y finaliza despus de la expulsin de la placenta. La placenta es un rgano de que desarrolla durante el embarazo para proporcionar oxgeno y nutrientes al beb en el tero.  La expulsin de la placenta puede requerir algunos pujos y es posible que usted tenga contracciones leves. La lactancia puede estimular las contracciones para ayudar a expulsar la placenta.  Luego de la expulsin de la placenta, el tero debe (contraerse) y Successquedar muy firme. Esto ayuda a Civil Service fast streamerdetener el sangrado en el tero. Para ayudar al tero a contraerse y controlar el Mullinsangrado, [place]u mdico puede: ? Administrarle un medicamento inyectable, a travs de un tubo (catter) intravenoso, por boca o a travs del recto (por va rectal). ? Masajear el abdomen o realizar un examen de la vagina para extraer cualquier cogulo de sangre que quede en el tero. ? Vaciar la  vejiga colocando un tubo flexible (catter) en la vejiga. ? Alentarla a amamantar a su beb. Una vez que termina el Drapertrabajo de [place]o, se los controlar a usted y a su beb atentamente para Warehouse managertener la seguridad de que ambos estn sanos hasta que estn listos para ir a casa. Su equipo de atencin Art gallery managermdica le ensear cmo cuidarse y cuidar a su beb. Esta informacin no tiene Theme park managercomo fin reemplazar el consejo del mdico. Asegrese de hacerle al mdico cualquier pregunta que tenga. Document Released: 10/11/2008 Document Revised: 02/07/2017 Document Reviewed: 11/13/2015 Elsevier Interactive Patient Education  2018 [employer]  Colestasis del Psychiatristembarazo (Cholestasis of Pregnancy) El trmino colestasis hace referencia a cualquier afeccin que provoca el enlentecimiento o la interrupcin del flujo del lquido de la digestin (bilis) que el [place] fabrica. La colestasis del embarazo es ms frecuente hacia el final del Psychiatristembarazo (tercertrimestre), pero puede presentarse en cualquier momento durante la gestacin. Con frecuencia, la afeccin desaparece tan pronto como nace el beb. La colestasis puede causar molestias, pero a menudo no tiene efectos nocivos para usted; sin embargo, puede ser daina para el beb. La colestasis puede aumentar el riesgo de que el beb nazca demasiado pronto (parto prematuro). CAUSAS La causa de la colestasis del embarazo no se conoce. Las hormonas del embarazo pueden afectar el funcionamiento de la vescula biliar. Normalmente, la vescula biliar contiene la bilis que el hgado fabrica hasta que se la necesita para ayudar a Location managerdigerir las grasas de la dieta. Las hormonas del Biomedical scientistembarazo pueden enlentecer el flujo de la bilis y hacer que retroceda al hgado. Luego, la bilis ingresa al torrente sanguneo y provoca sntomas de colestasis. FACTORES DE RIESGO Puede correr ms riesgo si:  Tuvo colestasis durante un embarazo anterior.  Tiene antecedentes familiares de colestasis.  Tiene problemas  de hgado.  Espera gemelos. SIGNOS Y SNTOMAS El sntoma ms frecuente de la colestasis del embarazo es la picazn intensa, especialmente de las palmas de las manos y las plantas de los pies. La picazn puede extenderse al resto del cuerpo y suele ser peor por la noche. Generalmente, no tendr una erupcin cutnea. Otros sntomas son:  Cansancio.  Coloracin amarillenta de la piel y la  parte blanca de los ojos (ictericia).  Orina de color oscuro.  Heces de color claro.  Prdida del apetito. DIAGNSTICO El mdico revisar sus antecedentes mdicos y le har un examen fsico. Tal vez le hagan anlisis de sangre para controlar la funcin heptica, el nivel de bilis y de bilirrubina. TRATAMIENTO El objetivo del tratamiento es hacer que se sienta ms cmoda y proteger al beb. El mdico puede recetar medicamentos para Production designer, theatre/television/film. El medicamento que se [place] tambin puede [employer] de los anlisis de sangre y Contractor a Conservator, museum/gallery al beb. Adems, el mdico puede darle vitaminaK antes del parto para evitar el sangrado excesivo. Es posible que el mdico quiera controlar frecuentemente al beb (monitoreo fetal), por ejemplo, cada 2semanas. Una vez que los pulmones del beb estn suficientemente desarrollados, el mdico puede recomendar el inicio (induccin) del [place] y el parto en la semana37de embarazo. INSTRUCCIONES PARA EL CUIDADO EN EL HOGAR  Use las cremas para Associate Professor y tome los medicamentos solamente como se lo haya indicado el mdico.  Tome baos de inmersin en agua fra para Associate Professor.  Mantenga las uas cortas para no irritar la piel al rascarse.  Concurra a todas las visitas de monitoreo fetal.  SOLICITE ATENCIN MDICA SI: Los sntomas empeoran a pesar del TEFL teacher. SOLICITE ATENCIN MDICA DE INMEDIATO SI: Comienza el trabajo de parto prematuro en su casa. ASEGRESE DE QUE:  Comprende estas instrucciones.  Controlar su  afeccin.  Recibir ayuda de inmediato si no mejora o si empeora.  Esta informacin no tiene Theme park manager el consejo del mdico. Asegrese de hacerle al mdico cualquier pregunta que tenga. Document Released: 08/08/2005 Document Revised: 11/19/2014 Document Reviewed: 08/21/2013 Elsevier Interactive Patient Education  2017 [employer].

## 2017-11-18 NOTE — Progress Notes (Signed)
Subjective:  Kari Shaw is a 32 y.o. G3P2002 at 3232w2d being seen today for ongoing prenatal care. [facility]. She has some itching at times. H/O cholestasis with last pregnancy. Dx with nephrolithiasis this past Spet but currently no Sx.  She is currently monitored for the following issues for this high-risk pregnancy and has Previous child with congenital anomaly, currently pregnant, antepartum; [redacted] weeks gestation of pregnancy; Supervision of high-risk pregnancy; Cholestasis of pregnancy; and History of nephrolithiasis on their problem list.  Patient reports no complaints.  Contractions: Irritability. Vag. Bleeding: None.  Movement: Present. Denies leaking of fluid.   The following portions of the patient's history were reviewed and updated as appropriate: allergies, current medications, past family history, past medical history, past social history, past surgical history and problem list. Problem list updated.  Objective:   Vitals:   11/18/17 1325  BP: 115/77  Pulse: 97  Weight: 72.1 kg (158 lb 14.4 oz)    Fetal Status:     Movement: Present     General:  Alert, oriented and cooperative. Patient is in no acute distress.  Skin: Skin is warm and dry. No rash noted.   Cardiovascular: Normal heart rate noted  Respiratory: Normal respiratory effort, no problems with respiration noted  Abdomen: Soft, gravid, appropriate for gestational age. Pain/Pressure: Present     Pelvic:  Cervical exam performed        Extremities: Normal range of motion.  Edema: None  Mental Status: Normal mood and affect. Normal behavior. Normal judgment and thought content.   Urinalysis:      Assessment and Plan:  Pregnancy: G3P2002 at 2132w2d  1. Supervision of high risk pregnancy in third trimester Stable - Cervicovaginal ancillary only  2. Cholestasis during pregnancy in third trimester Reviewed with pt. IOL this Sunday - US MFM FETAL BPP WO NON STRESS; Future -  US MFM OB COMP + 14 WK; Future  3. History of nephrolithiasis   Term labor symptoms and general obstetric precautions including but not limited to vaginal bleeding, contractions, leaking of fluid and fetal movement were reviewed in detail with the patient. Please refer to After Visit Summary for other counseling recommendations.  Return in about 5 weeks (around 12/23/2017).   Hermina StaggersErvin, Michael L, MD

## 2017-11-18 NOTE — Telephone Encounter (Signed)
Interpreter number [phone] Preadmission screen

## 2017-11-20 ENCOUNTER — Other Ambulatory Visit: Payer: Self-pay | Admitting: Advanced Practice Midwife

## 2017-11-20 ENCOUNTER — Other Ambulatory Visit: Payer: Self-pay | Admitting: Obstetrics and Gynecology

## 2017-11-20 ENCOUNTER — Ambulatory Visit (HOSPITAL_COMMUNITY)
Admission: RE | Admit: 2017-11-20 | Discharge: 2017-11-20 | Disposition: A | Discharge status: Home / Self Care | Payer: Self-pay | Source: Ambulatory Visit | Attending: Obstetrics and Gynecology | Admitting: Obstetrics and Gynecology

## 2017-11-20 DIAGNOSIS — Z3A36 36 weeks gestation of pregnancy: Secondary | ICD-10-CM | POA: Insufficient documentation

## 2017-11-20 DIAGNOSIS — Z8279 Family history of other congenital malformations, deformations and chromosomal abnormalities: Secondary | ICD-10-CM | POA: Insufficient documentation

## 2017-11-20 DIAGNOSIS — O26613 Liver and biliary tract disorders in pregnancy, third trimester: Principal | ICD-10-CM

## 2017-11-20 DIAGNOSIS — K831 Obstruction of bile duct: Secondary | ICD-10-CM

## 2017-11-20 DIAGNOSIS — Z362 Encounter for other antenatal screening follow-up: Secondary | ICD-10-CM | POA: Insufficient documentation

## 2017-11-20 LAB — CERVICOVAGINAL ANCILLARY ONLY
Chlamydia: NEGATIVE
NEISSERIA GONORRHEA: NEGATIVE

## 2017-11-24 ENCOUNTER — Inpatient Hospital Stay (HOSPITAL_COMMUNITY): Payer: Medicaid Other | Admitting: Anesthesiology

## 2017-11-24 ENCOUNTER — Encounter (HOSPITAL_COMMUNITY): Payer: Self-pay | Admitting: *Deleted

## 2017-11-24 ENCOUNTER — Inpatient Hospital Stay (HOSPITAL_COMMUNITY)
Admission: RE | Admit: 2017-11-24 | Discharge: 2017-11-24 | Disposition: A | Discharge status: Home / Self Care | Payer: Medicaid Other | Source: Ambulatory Visit | Attending: Obstetrics & Gynecology | Admitting: Obstetrics & Gynecology

## 2017-11-24 ENCOUNTER — Inpatient Hospital Stay (HOSPITAL_COMMUNITY)
Admission: AD | Admit: 2017-11-24 | Discharge: 2017-11-26 | DRG: 805 | Disposition: A | Discharge status: Home / Self Care | Payer: Medicaid Other | Source: Ambulatory Visit | Attending: Obstetrics & Gynecology | Admitting: Obstetrics & Gynecology

## 2017-11-24 DIAGNOSIS — Z91040 Latex allergy status: Secondary | ICD-10-CM

## 2017-11-24 DIAGNOSIS — O26613 Liver and biliary tract disorders in pregnancy, third trimester: Secondary | ICD-10-CM

## 2017-11-24 DIAGNOSIS — O2662 Liver and biliary tract disorders in childbirth: Principal | ICD-10-CM | POA: Diagnosis present

## 2017-11-24 DIAGNOSIS — Z3A37 37 weeks gestation of pregnancy: Secondary | ICD-10-CM | POA: Diagnosis not present

## 2017-11-24 DIAGNOSIS — K831 Obstruction of bile duct: Secondary | ICD-10-CM | POA: Diagnosis present

## 2017-11-24 DIAGNOSIS — O352XX0 Maternal care for (suspected) hereditary disease in fetus, not applicable or unspecified: Secondary | ICD-10-CM

## 2017-11-24 DIAGNOSIS — O26643 Intrahepatic cholestasis of pregnancy, third trimester: Secondary | ICD-10-CM | POA: Diagnosis present

## 2017-11-24 LAB — TYPE AND SCREEN
ABO/RH(D): O POS
Antibody Screen: NEGATIVE

## 2017-11-24 LAB — CBC
HEMATOCRIT: 36.4 % (ref 36.0–46.0)
HEMOGLOBIN: 12.5 g/dL (ref 12.0–15.0)
MCH: 30.8 pg (ref 26.0–34.0)
MCHC: 34.3 g/dL (ref 30.0–36.0)
MCV: 89.7 fL (ref 78.0–100.0)
Platelets: 274 10*3/uL (ref 150–400)
RBC: 4.06 MIL/uL (ref 3.87–5.11)
RDW: 12.9 % (ref 11.5–15.5)
WBC: 5.6 10*3/uL (ref 4.0–10.5)

## 2017-11-24 LAB — ABO/RH: ABO/RH(D): O POS

## 2017-11-24 MED ORDER — TERBUTALINE SULFATE 1 MG/ML IJ SOLN
0.2500 mg | Freq: Once | INTRAMUSCULAR | Status: DC | PRN
  Filled 2017-11-24: qty 1

## 2017-11-24 MED ORDER — OXYTOCIN 40 UNITS IN LACTATED RINGERS INFUSION - SIMPLE MED
2.5000 [IU]/h | INTRAVENOUS | Status: DC
  Administered 2017-11-24: 2.5 [IU]/h via INTRAVENOUS

## 2017-11-24 MED ORDER — ACETAMINOPHEN 325 MG PO TABS: 650.0000 mg | ORAL_TABLET | ORAL | Status: DC | PRN

## 2017-11-24 MED ORDER — LACTATED RINGERS IV SOLN
500.0000 mL | Freq: Once | INTRAVENOUS | Status: AC
  Administered 2017-11-24: 500 mL via INTRAVENOUS

## 2017-11-24 MED ORDER — OXYCODONE-ACETAMINOPHEN 5-325 MG PO TABS: 1.0000 | ORAL_TABLET | ORAL | Status: DC | PRN

## 2017-11-24 MED ORDER — FENTANYL CITRATE (PF) 100 MCG/2ML IJ SOLN: 100.0000 ug | INTRAMUSCULAR | Status: DC | PRN

## 2017-11-24 MED ORDER — PHENYLEPHRINE 40 MCG/ML (10ML) SYRINGE FOR IV PUSH (FOR BLOOD PRESSURE SUPPORT): 80.0000 ug | PREFILLED_SYRINGE | INTRAVENOUS | Status: DC | PRN

## 2017-11-24 MED ORDER — EPHEDRINE 5 MG/ML INJ
10.0000 mg | INTRAVENOUS | Status: DC | PRN
  Filled 2017-11-24: qty 2

## 2017-11-24 MED ORDER — FENTANYL 2.5 MCG/ML BUPIVACAINE 1/10 % EPIDURAL INFUSION (WH - ANES)
14.0000 mL/h | INTRAMUSCULAR | Status: DC | PRN
  Administered 2017-11-24: 10 mL/h via EPIDURAL
  Filled 2017-11-24: qty 100

## 2017-11-24 MED ORDER — PHENYLEPHRINE 40 MCG/ML (10ML) SYRINGE FOR IV PUSH (FOR BLOOD PRESSURE SUPPORT)
80.0000 ug | PREFILLED_SYRINGE | INTRAVENOUS | Status: DC | PRN
  Administered 2017-11-24 (×2): 80 ug via INTRAVENOUS
  Filled 2017-11-24: qty 5

## 2017-11-24 MED ORDER — OXYTOCIN BOLUS FROM INFUSION
500.0000 mL | Freq: Once | INTRAVENOUS | Status: AC
  Administered 2017-11-24: 500 mL via INTRAVENOUS

## 2017-11-24 MED ORDER — SOD CITRATE-CITRIC ACID 500-334 MG/5ML PO SOLN: 30.0000 mL | ORAL | Status: DC | PRN

## 2017-11-24 MED ORDER — SODIUM BICARBONATE 8.4 % IV SOLN
INTRAVENOUS | Status: DC | PRN
  Administered 2017-11-24: 3 mL via EPIDURAL
  Administered 2017-11-24: 4 mL via EPIDURAL

## 2017-11-24 MED ORDER — OXYTOCIN 40 UNITS IN LACTATED RINGERS INFUSION - SIMPLE MED
1.0000 m[IU]/min | INTRAVENOUS | Status: DC
  Administered 2017-11-24: 2 m[IU]/min via INTRAVENOUS
  Filled 2017-11-24: qty 1000

## 2017-11-24 MED ORDER — IBUPROFEN 600 MG PO TABS
600.0000 mg | ORAL_TABLET | Freq: Four times a day (QID) | ORAL | Status: DC
  Administered 2017-11-24 – 2017-11-26 (×7): 600 mg via ORAL
  Filled 2017-11-24 (×7): qty 1

## 2017-11-24 MED ORDER — DIPHENHYDRAMINE HCL 50 MG/ML IJ SOLN: 12.5000 mg | INTRAMUSCULAR | Status: DC | PRN

## 2017-11-24 MED ORDER — OXYCODONE-ACETAMINOPHEN 5-325 MG PO TABS: 2.0000 | ORAL_TABLET | ORAL | Status: DC | PRN

## 2017-11-24 MED ORDER — MISOPROSTOL 25 MCG QUARTER TABLET
25.0000 ug | ORAL_TABLET | ORAL | Status: DC | PRN
  Filled 2017-11-24: qty 1

## 2017-11-24 MED ORDER — EPHEDRINE 5 MG/ML INJ: 10.0000 mg | INTRAVENOUS | Status: DC | PRN

## 2017-11-24 MED ORDER — LACTATED RINGERS IV SOLN: 500.0000 mL | Freq: Once | INTRAVENOUS | Status: DC

## 2017-11-24 MED ORDER — LACTATED RINGERS IV SOLN
INTRAVENOUS | Status: DC
  Administered 2017-11-24 (×2): via INTRAVENOUS

## 2017-11-24 MED ORDER — ONDANSETRON HCL 4 MG/2ML IJ SOLN: 4.0000 mg | Freq: Four times a day (QID) | INTRAMUSCULAR | Status: DC | PRN

## 2017-11-24 MED ORDER — LIDOCAINE HCL (PF) 1 % IJ SOLN
30.0000 mL | INTRAMUSCULAR | Status: DC | PRN
  Filled 2017-11-24: qty 30

## 2017-11-24 MED ORDER — PHENYLEPHRINE 40 MCG/ML (10ML) SYRINGE FOR IV PUSH (FOR BLOOD PRESSURE SUPPORT)
80.0000 ug | PREFILLED_SYRINGE | INTRAVENOUS | Status: DC | PRN
  Filled 2017-11-24: qty 5
  Filled 2017-11-24: qty 10

## 2017-11-24 MED ORDER — LACTATED RINGERS IV SOLN: 500.0000 mL | INTRAVENOUS | Status: DC | PRN

## 2017-11-24 MED ORDER — FENTANYL 2.5 MCG/ML BUPIVACAINE 1/10 % EPIDURAL INFUSION (WH - ANES): 14.0000 mL/h | INTRAMUSCULAR | Status: DC | PRN

## 2017-11-24 MED ORDER — MISOPROSTOL 25 MCG QUARTER TABLET
25.0000 ug | ORAL_TABLET | ORAL | Status: DC | PRN
  Administered 2017-11-24: 25 ug via BUCCAL

## 2017-11-24 NOTE — Anesthesia Pain Management Evaluation Note (Signed)
  CRNA Pain Management Visit Note  Patient: Kari Shaw, 32 y.o., female  "Hello I am a [facility] the anesthesia [facility]. We have an anesthesia team available at all times to provide care throughout the hospital, including epidural management and anesthesia for C-section. I don't know your plan for the delivery whether it a natural birth, water birth, IV sedation, nitrous supplementation, doula or epidural, but we want to meet your pain goals."   1.Was your pain managed to your expectations on prior hospitalizations?   Yes   2.What is your expectation for pain management during this hospitalization?     Labor support. Patient for induction  3.How can we help you reach that goal? Labor support  Record the patient's initial score and the patient's pain goal.   Pain: 0  Pain Goal: 5 [facility] wants you to be able to say your pain was always managed very well.  Rica RecordsICKELTON,Kenyada Hy 11/24/2017

## 2017-11-24 NOTE — Anesthesia Preprocedure Evaluation (Signed)
Anesthesia Evaluation  Patient identified by MRN, date of birth, ID band Patient awake    History of Anesthesia Complications Negative for: history of anesthetic complications  Airway Mallampati: II  TM Distance: >3 FB Neck ROM: Full    Dental   Pulmonary neg pulmonary ROS,    Pulmonary exam normal        Cardiovascular negative cardio ROS Normal cardiovascular exam     Neuro/Psych negative neurological ROS  negative psych ROS   GI/Hepatic negative GI ROS, Neg liver ROS,   Endo/Other  negative endocrine ROS  Renal/GU negative Renal ROS  negative genitourinary   Musculoskeletal negative musculoskeletal ROS (+)   Abdominal Normal abdominal exam  (+)   Peds negative pediatric ROS (+)  Hematology negative hematology ROS (+)   Anesthesia Other Findings   Reproductive/Obstetrics (+) Pregnancy                             Anesthesia Physical Anesthesia Plan  ASA: II  Anesthesia Plan: Epidural   Post-op Pain Management:    Induction:   PONV Risk Score and Plan:   Airway Management Planned:   Additional Equipment:   Intra-op Plan:   Post-operative Plan:   Informed Consent: I have reviewed the patients History and Physical, chart, labs and discussed the procedure including the risks, benefits and alternatives for the proposed anesthesia with the patient or authorized representative who has indicated his/her understanding and acceptance.     Plan Discussed with:   Anesthesia Plan Comments:         Anesthesia Quick Evaluation

## 2017-11-24 NOTE — Progress Notes (Addendum)
Start of induction delayed due to multiple unsuccessful IV start attempts. 4 unsuccessful IV attempts by 2 different nurses. Anesthesia notified of need for IV start. Darlene, CNM aware.

## 2017-11-24 NOTE — Anesthesia Procedure Notes (Signed)
Epidural Patient location during procedure: OB Start time: 11/24/2017 4:08 PM  Staffing Anesthesiologist: Odette FractionHarkins, Ernestene Coover, MD Performed: anesthesiologist   Preanesthetic Checklist Completed: patient identified, site marked, surgical consent, pre-op evaluation, timeout performed, IV checked, risks and benefits discussed and monitors and equipment checked  Epidural Patient position: sitting Prep: DuraPrep Patient monitoring: heart rate, continuous pulse ox and blood pressure Location: L3-L4 Injection technique: LOR saline  Needle:  Needle type: Tuohy  Needle gauge: 17 G Needle length: 9 cm and 9 Needle insertion depth: 8 cm Catheter type: closed end flexible Catheter size: 20 Guage Catheter at skin depth: 14 cm Test dose: negative and 2% lidocaine with Epi 1:200 K  Assessment Events: blood not aspirated, injection not painful, no injection resistance, negative IV test and no paresthesia  Additional Notes Patient identified. All conversations with patient through interpreter. Risks/Benefits/Options discussed with patient including but not limited to bleeding, infection, nerve damage, paralysis, failed block, incomplete pain control, headache, blood pressure changes, nausea, vomiting, reactions to medication both or allergic, itching and postpartum back pain. Confirmed with bedside nurse the patient's most recent platelet count. Confirmed with patient that they are not currently taking any anticoagulation, have any bleeding history or any family history of bleeding disorders. Patient expressed understanding and wished to proceed. All questions were answered. Sterile technique was used throughout the entire procedure. Please see nursing notes for vital signs. Test dose was given through epidural catheter and negative prior to continuing to dose epidural or start infusion.  Patient was given instructions on fall risk and not to get out of bed. All questions and concerns addressed with  instructions to call with any issues.

## 2017-11-24 NOTE — Progress Notes (Signed)
Labor Progress Note  Kari Shaw is a 32 y.o. G3P2002 at 5446w1d  admitted for induction of labor due to cholestasis.  S: Comfortable.   O:  BP 116/70   Pulse 87   Temp 98.3 F (36.8 C) (Oral)   Resp 16   Ht 4\' 11"  (1.499 m)   Wt 157 lb 12.8 oz (71.6 kg)   LMP 03/09/2017   BMI 31.87 kg/m   No intake/output data recorded.  FHT:  FHR: 120 bpm, variability: moderate,  accelerations:  Present,  decelerations:  Absent UC:   irregular SVE:   Dilation: Closed Effacement (%): Thick Exam by:: Kari Shaw, CNM Membranes intact   Labs: Lab Results  Component Value Date   WBC 5.6 11/24/2017   HGB 12.5 11/24/2017   HCT 36.4 11/24/2017   MCV 89.7 11/24/2017   PLT 274 11/24/2017    Assessment / Plan: 32 y.o. G3P2002 3346w1d. Not in labor. Induction of labor due to cholestasis  Labor: Not in labor. Continue induction. S/p cytotec xq. FB palced. Will start pitocin.  Fetal Wellbeing:  Category I Pain Control:  Labor support without medications Anticipated MOD:  NSVD  Expectant management   Caryl AdaJazma Drako Maese, DO OB Fellow

## 2017-11-24 NOTE — H&P (Signed)
Obstetric History and Physical  Kari Shaw is a 32 y.o. G3P2002 with IUP at [redacted]w[redacted]d presenting for IOL for cholestasis. Patient states she has been having  none contractions, none vaginal bleeding, intact membranes, with active fetal movement.  No blurry vision, headaches or peripheral edema, itching, and RUQ pain.   Prenatal Course Source of Care: [facility] to [facility] Dating: By LMP --->  Estimated Date of Delivery: 12/14/17 Pregnancy complications or risks: Patient Active Problem List   Diagnosis Date Noted  . Cholestasis during pregnancy in third trimester 11/24/2017  . Supervision of high-risk pregnancy 11/18/2017  . Cholestasis of pregnancy 11/18/2017  . History of nephrolithiasis 11/18/2017  . Previous child with congenital anomaly, currently pregnant, antepartum 06/11/2017  . [redacted] weeks gestation of pregnancy    She plans to breastfeed She desires bilateral tubal ligation for postpartum contraception.   Sono:    @[redacted]w[redacted]d , CWD, normal anatomy, cephalic presentation, fundal placenta, 2838g, 55% EFW, BPP 8/8, AFI normal   Prenatal labs and studies: ABO, Rh: O/Positive/-- (07/19 0000) Antibody: Negative (07/19 0000) Rubella: Immune (07/19 0000) RPR: Nonreactive (11/13 0000)  HBsAg: Negative (07/19 0000)  HIV: Non-reactive (11/13 0000)  UVO:ZDGUYQIH (12/31 0000) 1 hr Glucola  122 - normal Genetic screening normal Anatomy US normal  Prenatal Transfer Tool  Maternal Diabetes: No Genetic Screening: Normal Maternal Ultrasounds/Referrals: Normal Fetal Ultrasounds or other Referrals:  Fetal echo Maternal Substance Abuse:  No Significant Maternal Medications:  None Significant Maternal Lab Results: None  Past Medical History:  Diagnosis Date  . Cholestasis   . Medical history non-contributory     Past Surgical History:  Procedure Laterality Date  . childbirth    . NO PAST SURGERIES      OB History  Gravida Para Term Preterm AB Living  3 2 2  0 0 2  SAB TAB Ectopic Multiple  Live Births  0 0 0   2    # Outcome Date GA Lbr Len/2nd Weight Sex Delivery Anes PTL Lv  3 Current           2 Term 2011    F Vag-Spont   LIV     Birth Comments: cholestasis  1 Term 2009    F Vag-Spont   LIV      Social History   Socioeconomic History  . Marital status: Married    Spouse name: None  . Number of children: None  . Years of education: None  . Highest education level: None  Social Needs  . Financial resource strain: None  . Food insecurity - worry: None  . Food insecurity - inability: None  . Transportation needs - medical: None  . Transportation needs - non-medical: None  Occupational History  . None  Tobacco Use  . Smoking status: Never Smoker  . Smokeless tobacco: Never Used  Substance and Sexual Activity  . Alcohol use: No  . Drug use: No  . Sexual activity: Yes    Birth control/protection: None  Other Topics Concern  . None  Social History Narrative   ** Merged History Encounter **        Family History  Problem Relation Age of Onset  . Heart defect Daughter        surgery age 27 months old    Medications Prior to Admission  Medication Sig Dispense Refill Last Dose  . acetaminophen (TYLENOL) 325 MG tablet Take 650 mg by mouth every 6 (six) hours as needed for pain.   Not Taking  . acyclovir (ZOVIRAX)  400 MG tablet 1 tablet every 4 hours 5 times daily (Patient not taking: Reported on 06/11/2017) 50 tablet 0 Not Taking  . cephALEXin (KEFLEX) 500 MG capsule Take 1 capsule (500 mg total) by mouth 3 (three) times daily. (Patient not taking: Reported on 11/18/2017) 21 capsule 0 Not Taking  . Prenatal Vit w/Fe-Methylfol-FA (PNV PO) Take by mouth.   Taking    Allergies  Allergen Reactions  . Latex Itching and Other (See Comments)    Gloves    Review of Systems: Negative except for what is mentioned in HPI.  Physical Exam: BP 122/78   Pulse (!) 118   Resp 16   Ht 4\' 11"  (1.499 m)   Wt 157 lb 12.8 oz (71.6 kg)   LMP 03/09/2017   BMI 31.87  kg/m  CONSTITUTIONAL: Well-developed, well-nourished female in no acute distress.  HENT:  Normocephalic, atraumatic, External right and left ear normal. Oropharynx is clear and moist EYES: Conjunctivae and EOM are normal. Pupils are equal, round, and reactive to light. No scleral icterus.  NECK: Normal range of motion, supple, no masses SKIN: Skin is warm and dry. No rash noted. Not diaphoretic. No erythema. No pallor. NEUROLOGIC: Alert and oriented to person, place, and time. Normal reflexes, muscle tone coordination. No cranial nerve deficit noted. PSYCHIATRIC: Normal mood and affect. Normal behavior. Normal judgment and thought content. CARDIOVASCULAR: Normal heart rate noted, regular rhythm RESPIRATORY: Effort and breath sounds normal, no problems with respiration noted ABDOMEN: Soft, nontender, nondistended, gravid. MUSCULOSKELETAL: Normal range of motion. No edema and no tenderness. 2+ distal pulses.  Cervical Exam: 0.5/50/-3  (11/18/17) Presentation: cephalic FHT:  Baseline rate 135 bpm   Variability moderate  Accelerations present   Decelerations none Contractions: Irregular   Pertinent Labs/Studies:   No results found for this or any previous visit (from the past 24 hour(s)).  Assessment : Kari Shaw is a 32 y.o. G3P2002 at 5532w1d being admitted for induction of labor due to cholestasis.  Plan: Labor: Induction with cytotec to help with cervical ripening. Will place foley balloon when able. Analgesia as needed. FWB: Reassuring fetal heart tracing.   GBS negative Delivery plan: Hopeful for vaginal delivery   Caryl AdaJazma Phelps, DO OB Fellow Faculty Practice, [facility] 11/24/2017, 9:00 AM

## 2017-11-24 NOTE — Progress Notes (Signed)
Labor Progress Note Kari Shaw is a 32 y.o. G3P2002 at 1526w1d presented for IOL for cholestasis S: Patient feeling more pressure  O:  BP 112/83   Pulse 84   Temp 98 F (36.7 C) (Oral)   Resp 16   Ht 4\' 11"  (1.499 m)   Wt 71.6 kg (157 lb 12.8 oz)   LMP 03/09/2017   SpO2 100%   BMI 31.87 kg/m  EFM: 150 bpm/mod var/pos acels/no decels  CVE: Dilation: 7 Effacement (%): 80 Cervical Position: Posterior Station: -1 Presentation: Vertex Exam by:: Dr. Rachelle HoraMoss   A&P: 32 y.o. [id number] 3826w1d here for IOL for cholestasis #Labor: Progressing well. Continue pitocin. Anticipate svd.   Rolm BookbinderAmber Aryn Safran, DO 9:04 PM

## 2017-11-24 NOTE — Progress Notes (Signed)
1933: Dr. Doroteo GlassmanPhelps ruptured patient's membranes with assistance of live Spanish interpreter.

## 2017-11-24 NOTE — Progress Notes (Signed)
Labor Progress Note  Kari Shaw is a 32 y.o. G3P2002 at 6741w1d  admitted for induction of labor due to cholestasis.  S: Comfortable with epidural.   O:  BP (!) 95/47   Pulse 66   Temp 98 F (36.7 C) (Oral)   Resp 18   Ht 4\' 11"  (1.499 m)   Wt 157 lb 12.8 oz (71.6 kg)   LMP 03/09/2017   BMI 31.87 kg/m   No intake/output data recorded.  FHT:  FHR: 125 bpm, variability: moderate,  accelerations:  Present,  decelerations:  Absent UC:   Regular every 1-4 minutes SVE:   Dilation: 6 Effacement (%): 70 Station: -3 Exam by:: Dr. Doroteo GlassmanPhelps Membranes intact   Labs: Lab Results  Component Value Date   WBC 5.6 11/24/2017   HGB 12.5 11/24/2017   HCT 36.4 11/24/2017   MCV 89.7 11/24/2017   PLT 274 11/24/2017    Assessment / Plan: 32 y.o. G3P2002 841w1d. Early labor. Induction of labor due to cholestasis  Labor: Progressing on Pitocin, will continue to increase then AROM. FB now out.  Fetal Wellbeing:  Category I Pain Control:  Epidural Anticipated MOD:  NSVD  Expectant management   Caryl AdaJazma Kaliann Coryell, DO OB Fellow

## 2017-11-25 LAB — RPR: RPR Ser Ql: NONREACTIVE

## 2017-11-25 MED ORDER — BENZOCAINE-MENTHOL 20-0.5 % EX AERO
1.0000 "application " | INHALATION_SPRAY | CUTANEOUS | Status: DC | PRN
  Administered 2017-11-25: 1 via TOPICAL
  Filled 2017-11-25: qty 56

## 2017-11-25 MED ORDER — ZOLPIDEM TARTRATE 5 MG PO TABS: 5.0000 mg | ORAL_TABLET | Freq: Every evening | ORAL | Status: DC | PRN

## 2017-11-25 MED ORDER — PRENATAL MULTIVITAMIN CH
1.0000 | ORAL_TABLET | Freq: Every day | ORAL | Status: DC
  Administered 2017-11-25 – 2017-11-26 (×2): 1 via ORAL
  Filled 2017-11-25 (×2): qty 1

## 2017-11-25 MED ORDER — ONDANSETRON HCL 4 MG PO TABS: 4.0000 mg | ORAL_TABLET | ORAL | Status: DC | PRN

## 2017-11-25 MED ORDER — COCONUT OIL OIL
1.0000 "application " | TOPICAL_OIL | Status: DC | PRN
  Administered 2017-11-25: 1 via TOPICAL
  Filled 2017-11-25: qty 120

## 2017-11-25 MED ORDER — ONDANSETRON HCL 4 MG/2ML IJ SOLN: 4.0000 mg | INTRAMUSCULAR | Status: DC | PRN

## 2017-11-25 MED ORDER — ACETAMINOPHEN 325 MG PO TABS
650.0000 mg | ORAL_TABLET | ORAL | Status: DC | PRN
  Administered 2017-11-25: 650 mg via ORAL
  Filled 2017-11-25: qty 2

## 2017-11-25 MED ORDER — TETANUS-DIPHTH-ACELL PERTUSSIS 5-2.5-18.5 LF-MCG/0.5 IM SUSP: 0.5000 mL | Freq: Once | INTRAMUSCULAR | Status: DC

## 2017-11-25 MED ORDER — SIMETHICONE 80 MG PO CHEW: 80.0000 mg | CHEWABLE_TABLET | ORAL | Status: DC | PRN

## 2017-11-25 MED ORDER — DIBUCAINE 1 % RE OINT: 1.0000 "application " | TOPICAL_OINTMENT | RECTAL | Status: DC | PRN

## 2017-11-25 MED ORDER — SENNOSIDES-DOCUSATE SODIUM 8.6-50 MG PO TABS
2.0000 | ORAL_TABLET | ORAL | Status: DC
  Administered 2017-11-25: 2 via ORAL
  Filled 2017-11-25: qty 2

## 2017-11-25 MED ORDER — WITCH HAZEL-GLYCERIN EX PADS: 1.0000 "application " | MEDICATED_PAD | CUTANEOUS | Status: DC | PRN

## 2017-11-25 MED ORDER — DIPHENHYDRAMINE HCL 25 MG PO CAPS: 25.0000 mg | ORAL_CAPSULE | Freq: Four times a day (QID) | ORAL | Status: DC | PRN

## 2017-11-25 NOTE — Anesthesia Postprocedure Evaluation (Signed)
Anesthesia Post Note  Patient: Kari Shaw  Procedure(s) Performed: AN AD HOC LABOR EPIDURAL     Patient location during evaluation: Mother Baby Anesthesia Type: Epidural Level of consciousness: awake and alert and oriented Pain management: satisfactory to patient Vital Signs Assessment: post-procedure vital signs reviewed and stable Respiratory status: respiratory function stable Cardiovascular status: stable Postop Assessment: no headache, no backache, epidural receding, patient able to bend at knees, no signs of nausea or vomiting and adequate PO intake Anesthetic complications: no    Last Vitals:  Vitals:   11/25/17 0056 11/25/17 0509  BP: (!) 99/57 98/64  Pulse: 76 73  Resp: 18 18  Temp: 36.9 C 36.7 C  SpO2: 99%     Last Pain:  Vitals:   11/25/17 0509  TempSrc: Oral  PainSc:    Pain Goal:                 Zahira Brummond

## 2017-11-25 NOTE — Progress Notes (Signed)
Living infant female delivered by Dr. Rachelle HoraMoss at 2205 [facility] from [facility], the live Spanish interpreter.

## 2017-11-25 NOTE — Plan of Care (Signed)
  Role Relationship: Ability to demonstrate positive interaction with newborn will improve 11/25/2017 1644 by Karn Cassissborne, Adelbert Gaspard H, RN Note Kari Shaw, in house interpreter, used for interpretation. Mother concerned since baby has been sleepy and not eating frequently. Baby has latched and has latched well in the past. Mother states she only breast fed her previous child; however, would like nurse's opinion on formula since baby has not eaten much. Discussed with mother that it is normal for baby to be sleepy for the first day. Encouraged mother to remove baby blankets and clothing and do frequent skin to skin. Baby has been wrapped in warm, fleece blanket. Encouraged frequent hand expression, in which mother states she is doing. Discussed with mother that formula is not recommended at this time and discussed LEAD. Mother feels better after discussion about baby not needing formula. Assessed baby and removed all blankets and clothing. Baby woke and assisted mother to latch baby. Once baby woke up more, baby began good suck pattern with swallows noted. Mother more confident at this time.

## 2017-11-25 NOTE — Plan of Care (Signed)
  Education: Knowledge of condition will improve 11/25/2017 1100 by Karn Cassissborne, Trinh Sanjose H, RN Note In house interpreter checked with patient, who declines interpreter assistance. Patient and family member state they know how to order food. Upon assessment of patient, patient declines interpreter and states she will ask for one if she feels the need for one. Patient able to communicate all information discussed and verbalizes understanding.

## 2017-11-25 NOTE — Progress Notes (Signed)
Admission nutrition screen triggered for unintentional weight loss > 10 lbs within the last month. Chsrt indicates no weight loss. Patients chart reviewed and assessed  for nutritional risk. Patient is determined to be at low nutrition  risk.

## 2017-11-25 NOTE — Progress Notes (Signed)
Post Partum Day 1 Subjective: no complaints, up ad lib, voiding and tolerating PO  Objective: Blood pressure 98/64, pulse 73, temperature 98 F (36.7 C), temperature source Oral, resp. rate 18, height 4\' 11"  (1.499 m), weight 157 lb 12.8 oz (71.6 kg), last menstrual period 03/09/2017, SpO2 99 %, unknown if currently breastfeeding.  Physical Exam:  General: alert, cooperative and no distress Lochia: appropriate Uterine Fundus: firm Incision: n/a DVT Evaluation: No evidence of DVT seen on physical exam.  Recent Labs    11/24/17 0830  HGB 12.5  HCT 36.4    Assessment/Plan: Plan for discharge tomorrow and Breastfeeding   LOS: 1 day   Rolm BookbinderCaroline M Becker Christopher CNM 11/25/2017, 6:33 AM

## 2017-11-26 LAB — BIRTH TISSUE RECOVERY COLLECTION (PLACENTA DONATION)

## 2017-11-26 NOTE — Lactation Note (Signed)
This note was copied from a baby's chart. Lactation Consultation Note  Patient Name: Kari Shaw VWUJW'JToday's Date: 11/26/2017 Reason for consult: Initial assessment;Infant < 6lbs;Infant weight loss;Early term 37-38.6wks   Initial assessment with exp BF mom of 35 hour old Early term infant. Infant with 10 BF for 10-21 minutes, 4 attempts, 2 voids and 3 stools in the last 24 hours. Infant weight 5 lb 15.1 oz with weight loss of 7%. LATCH scores 6-8. Spoke with mom via [facility] interpreter [place] [phone].  Mom reports she feels infant is eating colostrum because he is stooling. Mom reports nipple tenderness with initial latch that improves with feeding. Mom is feeling fuller today. Mom reports infant is sleepy at times and she is stroking him as needed to keep him feeding.   Reviewed limiting stimulation to infant between feedings and making sure infant eats at least every 3 hours. Worked with mom on hand expression with mom and able to express colostrum easily. Spoon fed infant 1 cc colostrum and he awakened to feed. Enc mom to hand express and spoon feed before each feeding to awaken infant and after BF to given infant dessert. Discussed importance of calories for this infant due to GA and size. Mom did well with awakening techniques with feeding. Reviewed positioning with mom and enc mom to offer both breasts with each feeding.   BF Resources handout and LC Brochure given, mom informed of IP/OP Services, BF Support Groups and LC phone #. [facility] and has follow up appt. Parents are hoping to go home today although mom and baby may benefit with continued feeding practice and assistance.   Gave mom manual pump to use at home to stimulate milk production and to offer supplement. May need to start electric pump today if infant stays and infant is sleepy for feedings.  Mom and dad concerned with cluster feeding last night, discussed this is normal and to feed infant with any feeding cues,  parents were aware of what feeding cues were.    Enc mom to call out for feeding assistance as needed. [facility], RN and Dr. Jena GaussHaddix.      Maternal Data Formula Feeding for Exclusion: No Has patient been taught Hand Expression?: Yes Does the patient have breastfeeding experience prior to this delivery?: Yes  Feeding Feeding Type: Breast Milk Length of feed: 15 min(still BF when LC left room)  LATCH Score Latch: Repeated attempts needed to sustain latch, nipple held in mouth throughout feeding, stimulation needed to elicit sucking reflex.  Audible Swallowing: A few with stimulation  Type of Nipple: Everted at rest and after stimulation  Comfort (Breast/Nipple): Soft / non-tender  Hold (Positioning): No assistance needed to correctly position infant at breast.  LATCH Score: 8  Interventions Interventions: Breast feeding basics reviewed;Support pillows;Assisted with latch;Position options;Skin to skin;Expressed milk;Breast massage;Breast compression;Hand pump;Hand express  Lactation Tools Discussed/Used WIC Program: Yes Pump Review: Setup, frequency, and cleaning;Milk Storage Initiated by:: Noralee StainSharon Charnese Federici, RN, IBCLC Date initiated:: 11/26/17   Consult Status Consult Status: Follow-up Date: 11/27/17 Follow-up type: In-patient    Silas FloodSharon S Kabella Cassidy 11/26/2017, 10:27 AM

## 2017-11-26 NOTE — Discharge Summary (Signed)
OB Discharge Summary     Patient Name: Kari Shaw DOB: 03/20/1986 MRN: [id number]  Date of admission: 11/24/2017 Delivering MD: Rolm BookbinderMOSS, AMBER   Date of discharge: 11/26/2017  Admitting diagnosis: induction Intrauterine pregnancy: 4750w1d     Secondary diagnosis:  Active Problems:   Cholestasis during pregnancy in third trimester   SVD (spontaneous vaginal delivery)  Additional problems: None     Discharge diagnosis: Term Pregnancy Delivered                                                                                                Post partum procedures:NA  Augmentation: AROM, Pitocin, Cytotec and Foley Balloon  Complications: None  Hospital course:  Induction of Labor With Vaginal Delivery   32 y.o. yo G3P3003 at 7650w1d was admitted to the hospital 11/24/2017 for induction of labor.  Indication for induction: Cholestasis of pregnancy.  Patient had an uncomplicated labor course as follows: Membrane Rupture Time/Date: 7:33 PM ,11/24/2017   Intrapartum Procedures: Episiotomy: None [1]                                         Lacerations:  1st degree [2]  Patient had delivery of a Viable infant.  Information for the patient's newborn:  Idamae LusherSoriano, Boy Cherilynn [[id number]][[id number]]  Delivery Method: Vaginal, Spontaneous(Filed from Delivery Summary)   11/24/2017  Details of delivery can be found in separate delivery note.  Patient had a routine postpartum course. Patient is discharged home 11/26/17.  Physical exam  Vitals:   11/25/17 0747 11/25/17 1000 11/25/17 1759 11/26/17 0551  BP:  111/71 (!) 98/58 103/61  Pulse:  90 68 79  Resp:  16 17 16   Temp:  98.4 F (36.9 C) 98.5 F (36.9 C) 97.9 F (36.6 C)  TempSrc:  Oral Oral Oral  SpO2:      Weight: 148 lb (67.1 kg)     Height:       General: alert, cooperative and no distress Lochia: appropriate Uterine Fundus: firm Incision: N/A DVT Evaluation: No evidence of DVT seen on physical exam. Labs: Lab Results  Component Value Date   WBC 5.6 11/24/2017   HGB 12.5 11/24/2017   HCT 36.4 11/24/2017   MCV 89.7 11/24/2017   PLT 274 11/24/2017   CMP Latest Ref Rng & Units 11/01/2017  Glucose 65 - 99 mg/dL 782(N108(H)  BUN 6 - 20 mg/dL 9  Creatinine 5.620.44 - 1.301.00 mg/dL 8.650.45  Sodium 784135 - 696145 mmol/L 136  Potassium 3.5 - 5.1 mmol/L 3.7  Chloride 101 - 111 mmol/L 106  CO2 22 - 32 mmol/L 21(L)  Calcium 8.9 - 10.3 mg/dL 2.9(B8.8(L)  Total Protein 6.5 - 8.1 g/dL 6.1(L)  Total Bilirubin 0.3 - 1.2 mg/dL 1.0  Alkaline Phos 38 - 126 U/L 104  AST 15 - 41 U/L 20  ALT 14 - 54 U/L 18    Discharge instruction: per After Visit Summary and "Baby and Me Booklet".  After visit meds:  Allergies as of 11/26/2017  Reactions   Latex Itching, Other (See Comments)   Gloves      Medication List    STOP taking these medications   cephALEXin 500 MG capsule Commonly known as:  KEFLEX   PNV PO     TAKE these medications   acyclovir 400 MG tablet Commonly known as:  ZOVIRAX 1 tablet every 4 hours 5 times daily       Diet: routine diet  Activity: Advance as tolerated. Pelvic rest for 6 weeks.   Outpatient follow up:6 weeks Follow up Appt:No future appointments. Follow up Visit:No Follow-up on file.  Postpartum contraception: IUD Paragard  Newborn Data: Live born female  Birth Weight: 6 lb 6.5 oz (2906 g) APGAR: 8, 9  Newborn Delivery   Birth date/time:  11/24/2017 22:05:00 Delivery type:  Vaginal, Spontaneous     Baby Feeding: Bottle and Breast Disposition:home with mother   11/26/2017 Marylene Land, CNM

## 2017-11-27 ENCOUNTER — Encounter: Payer: Self-pay | Admitting: *Deleted

## 2017-11-27 ENCOUNTER — Ambulatory Visit: Payer: Self-pay

## 2017-11-27 NOTE — Lactation Note (Signed)
This note was copied from a baby's chart. Lactation Consultation Note  Patient Name: Kari Shaw'UToday's Date: 11/27/2017 Reason for consult: Follow-up assessment;Infant weight loss;Early term 37-38.6wks;Infant < 6lbs;Nipple pain/trauma  Mom called for assistance to feed baby at the breast with an SNS. Baby latched with very little assistance, only corrected the position of the lips, they're not flanged at first. Baby emptied the 20 ml of Gerber formula in 10 minutes at the breast, mom felt much more comfortable with SNS feeding. Baby was still latched on when exiting the room, but mom said she'll latch baby on the other breast next. She was advised to pump afterwards.  Morayo Leven S [employer]

## 2017-11-27 NOTE — Lactation Note (Signed)
This note was copied from a baby's chart. Lactation Consultation Note  Patient Name: Kari Shaw ZOXWR'UToday's Date: 11/27/2017 Reason for consult: Follow-up assessment  Assisted mom with latch at the breast with SNS system. Baby took a little longer to latch this time, but after he got a seal at the nipple he was able to take 20 ml of formula. Breast look now full at examination, and very tender to touch. Mom was able to latch baby on with very little help and both parents now feel familiar and confident with SNS feeding system. Mom was advised to go up to 30 ml of product (breastmilk preferably) through the SNS. She'll continue pumping and use her EBM with the SNS before she supplements with formula. [facility].  Maternal Data    Feeding Feeding Type: Formula Length of feed: 15 min  LATCH Score Latch: Repeated attempts needed to sustain latch, nipple held in mouth throughout feeding, stimulation needed to elicit sucking reflex.  Audible Swallowing: Spontaneous and intermittent  Type of Nipple: Everted at rest and after stimulation  Comfort (Breast/Nipple): Filling, red/small blisters or bruises, mild/mod discomfort  Hold (Positioning): Assistance needed to correctly position infant at breast and maintain latch.  LATCH Score: 7  Interventions Interventions: Assisted with latch;Skin to skin;Breast massage;Breast compression;Adjust position;Support pillows  Lactation Tools Discussed/Used Tools: Supplemental Nutrition System Breast pump type: Double-Electric Breast Pump   Consult Status Consult Status: PRN Date: 11/29/17 Follow-up type: Call as needed    Huxton Glaus Venetia ConstableS Leilany Digeronimo 11/27/2017, 3:22 PM

## 2017-11-27 NOTE — Lactation Note (Signed)
This note was copied from a baby's chart. Lactation Consultation Note  Patient Name: Kari Shaw WUJWJ'XToday's Date: 11/27/2017 Reason for consult: Follow-up assessment;Infant weight loss;Early term 37-38.6wks;Infant < 6lbs;Nipple pain/trauma  2359 hours old 3037 1/7 week female who is now being partially BF and formula fed by his mother. Mom was holding baby STS upon entering the room, per mom baby has cluster feeding every hour during the night. Mom was also started with a DEBP and was able to pump 1 ml from both breasts; baby was spoon fed mother's colostrum.  Nipples show sign of irritation, and small blisters, mom complains of some soreness. Advised mom to rub some coconut oil in between feedings.  Supplementation was started with [name] this morning due to the fact that baby was at 11% weight loss and mom's milk has not come in yet. Used an SNS to supplement at the breast, baby has trouble latching on (lips tucked) at first and sustaining the latch, but after repositioning and pulling lips he was able to take the 20 ml of formula offered.  Mom was unsure about the SNS she kept asking if the tube would hurt baby's mouth and she was concerned about baby's lips being sore due to the tubing. Reassured mom about the safety of the SNS feeding system while teaching her how to assemble and washing the all pump parts and SNS.   Mom was encouraged to pump after feedings, at least every 3 hours, and to supplement with 20-25 ml of formula and/or expressed breastmilk. She may choose to supplement in another manner. Lactation to follow up PRN. [name].  Feeding Feeding Type: Formula Length of feed: 15 min  LATCH Score Latch: Grasps breast easily, tongue down, lips flanged, rhythmical sucking.(with formula supplement on the breast only)  Audible Swallowing: Spontaneous and intermittent(with formula supplement at breast)  Type of Nipple: Everted at rest and after  stimulation  Comfort (Breast/Nipple): Filling, red/small blisters or bruises, mild/mod discomfort  Hold (Positioning): Assistance needed to correctly position infant at breast and maintain latch.  LATCH Score: 8  Interventions Interventions: Breast feeding basics reviewed;Assisted with latch;Skin to skin;Breast massage;Hand express;Breast compression;Adjust position;Support pillows;Position options;Expressed milk;DEBP  Lactation Tools Discussed/Used Tools: Pump;Supplemental Nutrition System Breast pump type: Double-Electric Breast Pump WIC Program: Yes Pump Review: Setup, frequency, and cleaning;Milk Storage Initiated by:: [name] RN IBCLC Date initiated:: 11/27/17   Consult Status Consult Status: Follow-up Date: 11/28/17 Follow-up type: In-patient    Dearius Hoffmann Venetia ConstableS Adil Tugwell 11/27/2017, 9:55 AM

## 2018-01-06 ENCOUNTER — Ambulatory Visit: Payer: Self-pay | Admitting: Advanced Practice Midwife

## 2018-08-22 IMAGING — US US MFM OB FOLLOW-UP
1 series · 13 of 28 positions shown · non-contrast
Comparison: none

[Series 1: us mfm ob follow-up · 13 of 57 slices shown]
[im 3/57]
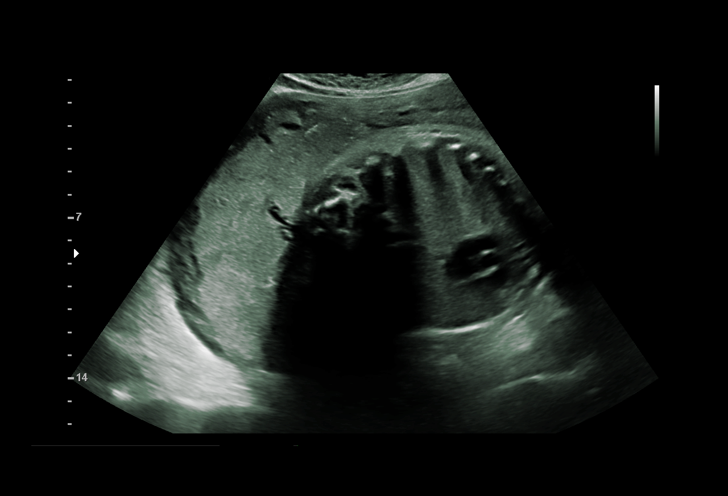
[im 7/57]
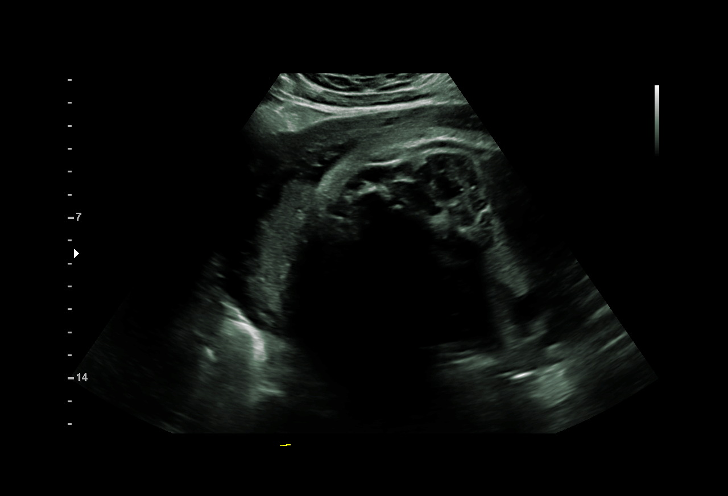
[im 11/57]
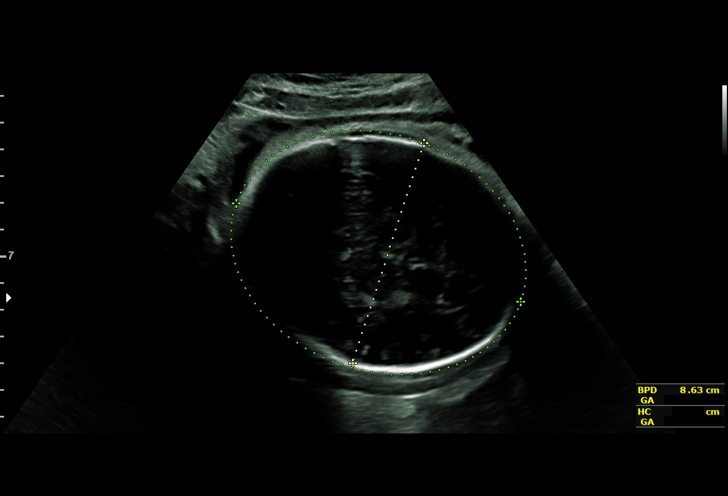
[im 15/57]
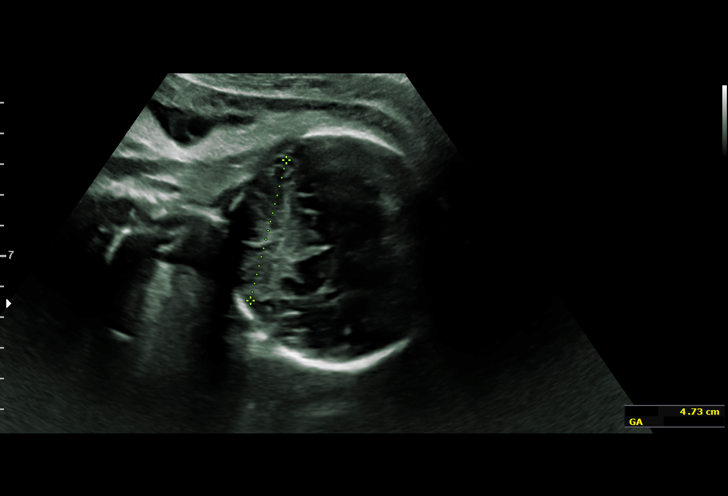
[im 19/57]
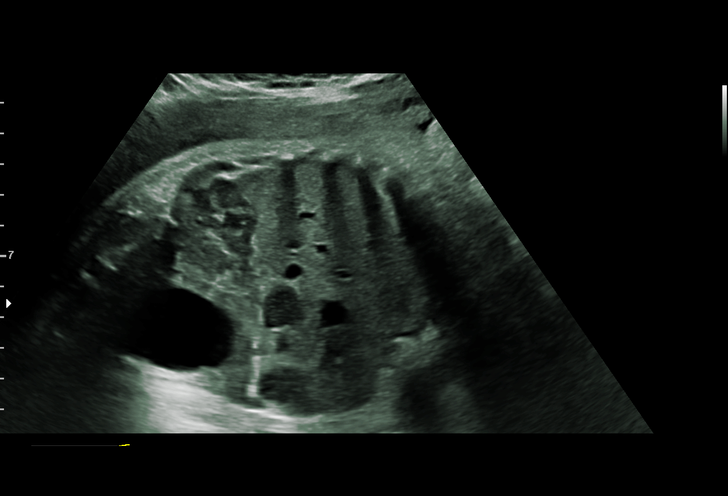
[im 23/57]
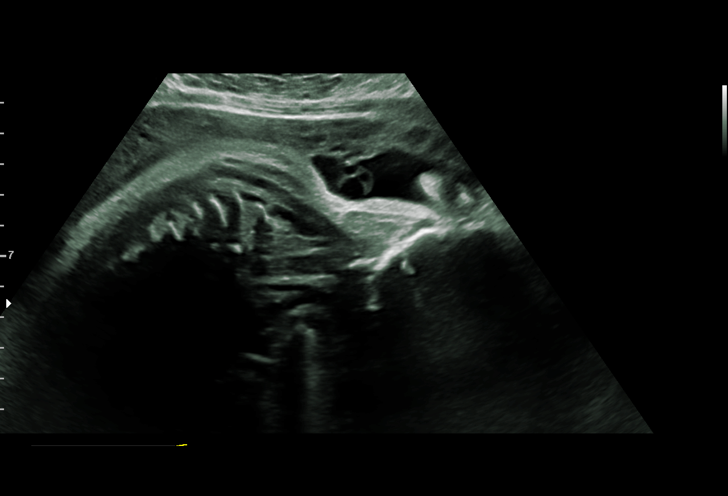
[im 30/57]
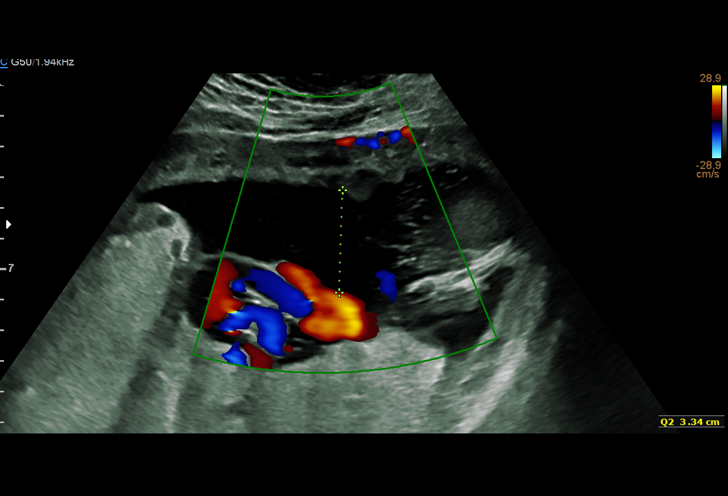
[im 34/57]
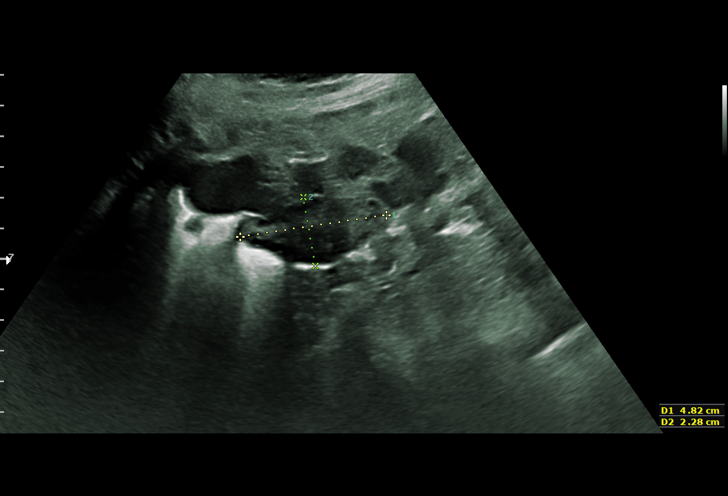
[im 38/57]
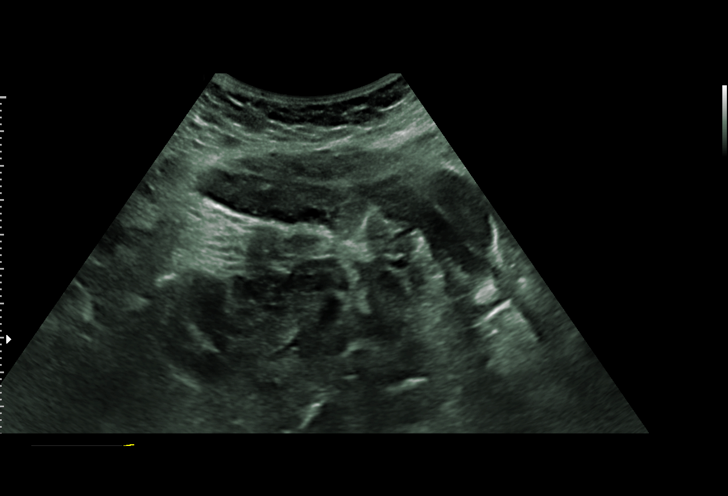
[im 42/57]
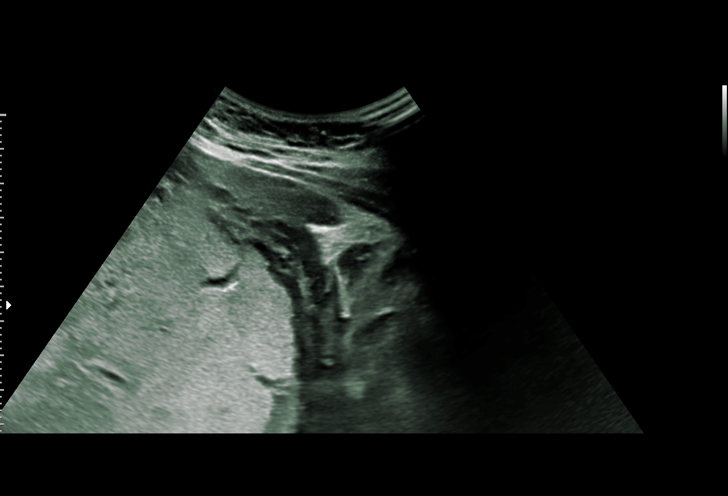
[im 46/57]
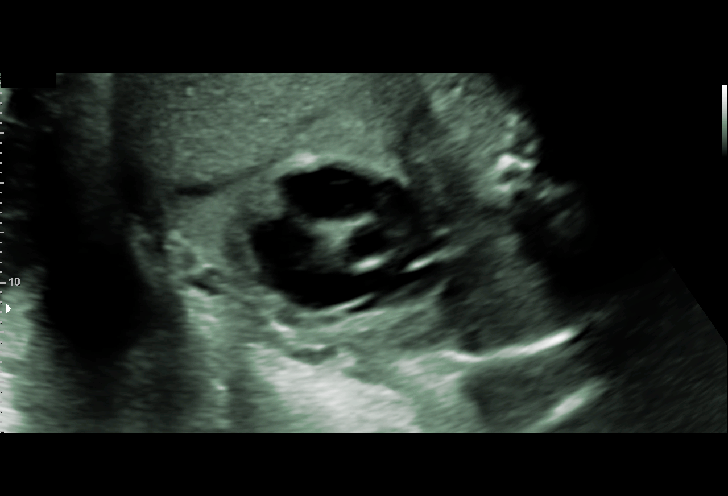
[im 50/57]
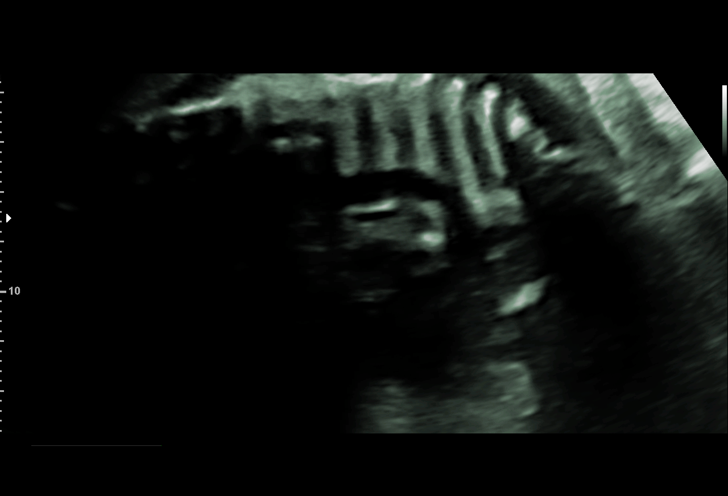
[im 54/57]
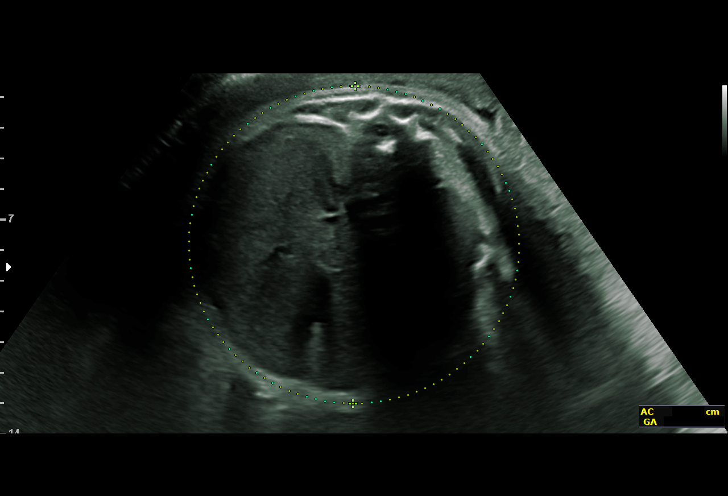

[13 of 28 positions shown; findings below may reference images not displayed]

OB/Gyn Clinic

1  ANNA-KAROLIINA KIANDER            [id number]      [id number]     [id number]
2  ANNA-KAROLIINA KIANDER            [id number]      [id number]     [id number]
Indications

36 weeks gestation of pregnancy
Family history of congenital anomaly
(daughter ? VSD; surgery at [REDACTED])
ECHO 08-01-17 Normal
Encounter for other antenatal screening
follow-up
Cholestasis of pregnancy, third trimester      T32.26G2QG.6
OB History

Blood Type:            Height:  4'11"  Weight (lb):  132       BMI:
Gravidity:    3         Term:   2
Living:       2
Fetal Evaluation

Num Of Fetuses:     1
Fetal Heart         135
Rate(bpm):
Cardiac Activity:   Observed
Presentation:       Cephalic
Placenta:           Fundal LT, above cervical os
P. Cord Insertion:  Visualized, central

Amniotic Fluid
AFI FV:      Subjectively within normal limits

AFI Sum(cm)     %Tile       Largest Pocket(cm)
12.96           45

RUQ(cm)       RLQ(cm)       LUQ(cm)        LLQ(cm)
3.32
Biophysical Evaluation

Amniotic F.V:   Within normal limits       F. Tone:        Observed
F. Movement:    Observed                   Score:          [DATE]
F. Breathing:   Observed
Biometry

BPD:      86.3  mm     G. Age:  34w 6d         16  %    CI:        73.59   %    70 - 86
FL/HC:      20.7   %    20.8 -
HC:      319.6  mm     G. Age:  36w 0d         13  %    HC/AC:      0.96        0.92 -
AC:      333.1  mm     G. Age:  37w 1d         78  %    FL/BPD:     76.8   %    71 - 87
FL:       66.3  mm     G. Age:  34w 1d          4  %    FL/AC:      19.9   %    20 - 24
HUM:      60.7  mm     G. Age:  35w 1d         41  %
CER:      48.5  mm     G. Age:  N/A          > 95  %

Est. FW:    7646  gm      6 lb 4 oz     55  %
Gestational Age

LMP:           36w 4d        Date:  03/09/17                 EDD:   12/14/17
U/S Today:     35w 4d                                        EDD:   12/21/17
Best:          36w 4d     Det. By:  LMP  (03/09/17)          EDD:   12/14/17
Anatomy

Cranium:               Appears normal         Aortic Arch:            Previously seen
Cavum:                 Appears normal         Ductal Arch:            Appears normal
Ventricles:            Appears normal         Diaphragm:              Appears normal
Choroid Plexus:        Appears normal         Stomach:                Appears normal, left
sided
Cerebellum:            Appears normal         Abdomen:                Appears normal
Posterior Fossa:       Appears normal         Abdominal Wall:         Appears nml (cord
insert, abd wall)
Nuchal Fold:           Previously seen        Cord Vessels:           Appears normal (3
vessel cord)
Face:                  Orbits pr.  nl; profileKidneys:                Appear normal
not well visualize
Lips:                  Previously seen        Bladder:                Appears normal
Thoracic:              Appears normal         Spine:                  Previously seen
Heart:                 Appears normal         Upper Extremities:      Previously seen
(4CH, axis, and
situs)
RVOT:                  Appears normal         Lower Extremities:      Previously seen
LVOT:                  Appears normal

Other:  Heels prev. visualized. Technically difficult due to fetal position. Fetus
appears to be a male.
Cervix Uterus Adnexa

Cervix
Not visualized (advanced GA >04wks)

Left Ovary
Within normal limits.

Right Ovary
Within normal limits.
Impression

Singleton intrauterine pregnancy at 36+4 with cholestasis of
pregnancy
Interval review of the anatomy shows no sonographic
markers for aneuploidy or structural anomalies
Amniotic fluid volume is normal with an AFI of 13cm
Estimated fetal weight is 2838g which is growth in the 55th
percentile
BPP [DATE]
Recommendations

Would continue antenatal testing until delivery. Delivery past
37 weeks can be considered for patients with intrahepatic
cholestasis of pregnancy

## 2019-04-28 ENCOUNTER — Emergency Department (HOSPITAL_BASED_OUTPATIENT_CLINIC_OR_DEPARTMENT_OTHER)
Admission: EM | Admit: 2019-04-28 | Discharge: 2019-04-28 | Disposition: A | Discharge status: Home / Self Care | Payer: Self-pay | Attending: Emergency Medicine | Admitting: Emergency Medicine

## 2019-04-28 ENCOUNTER — Other Ambulatory Visit: Payer: Self-pay

## 2019-04-28 ENCOUNTER — Encounter (HOSPITAL_BASED_OUTPATIENT_CLINIC_OR_DEPARTMENT_OTHER): Payer: Self-pay | Admitting: *Deleted

## 2019-04-28 DIAGNOSIS — N3000 Acute cystitis without hematuria: Secondary | ICD-10-CM | POA: Insufficient documentation

## 2019-04-28 LAB — PREGNANCY, URINE: Preg Test, Ur: NEGATIVE

## 2019-04-28 LAB — URINALYSIS, MICROSCOPIC (REFLEX)

## 2019-04-28 LAB — URINALYSIS, ROUTINE W REFLEX MICROSCOPIC
Bilirubin Urine: NEGATIVE
Glucose, UA: NEGATIVE mg/dL
Ketones, ur: NEGATIVE mg/dL
Leukocytes,Ua: NEGATIVE
Nitrite: POSITIVE — AB
Protein, ur: NEGATIVE mg/dL
Specific Gravity, Urine: 1.015 (ref 1.005–1.030)
pH: 7 (ref 5.0–8.0)

## 2019-04-28 MED ORDER — IBUPROFEN 400 MG PO TABS
600.0000 mg | ORAL_TABLET | Freq: Once | ORAL | Status: AC
  Administered 2019-04-28: 600 mg via ORAL
  Filled 2019-04-28: qty 1

## 2019-04-28 MED ORDER — CEPHALEXIN 250 MG PO CAPS
500.0000 mg | ORAL_CAPSULE | Freq: Once | ORAL | Status: AC
  Administered 2019-04-28: 500 mg via ORAL
  Filled 2019-04-28: qty 2

## 2019-04-28 MED ORDER — CEPHALEXIN 500 MG PO CAPS: 500.0000 mg | ORAL_CAPSULE | Freq: Three times a day (TID) | ORAL | 0 refills | Status: AC

## 2019-04-28 NOTE — ED Triage Notes (Addendum)
Lower abdominal pain since yesterday. No vomiting. Her skin hurts when touched. She feels like her new IUD is the cause of the pain. Her left lower abdomen is distended.

## 2019-04-29 NOTE — ED Provider Notes (Signed)
[facility] EMERGENCY DEPARTMENT Provider Note   CSN: [id number] Arrival date & time: 04/28/19  1820     History   Chief Complaint Chief Complaint  Patient presents with  . Abdominal Pain    HPI Kari Shaw is a 33 y.o. female.     HPI   Language barrier. Spanish interpretor used. 1d hx of lower abdominal pain. Suprapubic to LLQ. Urine stronger in odor and darker in color but no dysuria. Pain constant. No appreciable exacerbating or relieving factors. No fever or chills. No n/v. No diarrhea. No unusual vaginal bleeding or discharge. Doesn't think she is pregnant. Had IUD placed last year and questions if pain may be from that.   Past Medical History:  Diagnosis Date  . Cholestasis   . Medical history non-contributory     Patient Active Problem List   Diagnosis Date Noted  . Cholestasis during pregnancy in third trimester 11/24/2017  . SVD (spontaneous vaginal delivery) 11/24/2017  . Supervision of high-risk pregnancy 11/18/2017  . Cholestasis of pregnancy 11/18/2017  . History of nephrolithiasis 11/18/2017  . Previous child with congenital anomaly, currently pregnant, antepartum 06/11/2017    Past Surgical History:  Procedure Laterality Date  . childbirth    . NO PAST SURGERIES       OB History    Gravida  3   Para  3   Term  3   Preterm  0   AB  0   Living  3     SAB  0   TAB  0   Ectopic  0   Multiple  0   Live Births  3            Home Medications    Prior to Admission medications   Medication Sig Start Date End Date Taking? Authorizing Provider  acyclovir (ZOVIRAX) 400 MG tablet 1 tablet every 4 hours 5 times daily 01/04/14  Yes Harden Mo, MD  cephALEXin (KEFLEX) 500 MG capsule Take 1 capsule (500 mg total) by mouth 3 (three) times daily. 04/28/19   Virgel Manifold, MD    Family History Family History  Problem Relation Age of Onset  . Heart defect Daughter        surgery age 13 months old    Social  History Social History   Tobacco Use  . Smoking status: Never Smoker  . Smokeless tobacco: Never Used  Substance Use Topics  . Alcohol use: No  . Drug use: No     Allergies   Latex   Review of Systems Review of Systems  All systems reviewed and negative, other than as noted in HPI.  Physical Exam Updated Vital Signs BP 112/71   Pulse 67   Temp 98.2 F (36.8 C) (Oral)   Resp 20   Ht 4\' 11"  (1.499 m)   Wt 64.9 kg   LMP 01/11/2019   SpO2 100%   BMI 28.88 kg/m   Physical Exam Vitals signs and nursing note reviewed.  Constitutional:      General: She is not in acute distress.    Appearance: She is well-developed.  HENT:     Head: Normocephalic and atraumatic.  Eyes:     General:        Right eye: No discharge.        Left eye: No discharge.     Conjunctiva/sclera: Conjunctivae normal.  Neck:     Musculoskeletal: Neck supple.  Cardiovascular:     Rate and  Rhythm: Normal rate and regular rhythm.     Heart sounds: Normal heart sounds. No murmur. No friction rub. No gallop.   Pulmonary:     Effort: Pulmonary effort is normal. No respiratory distress.     Breath sounds: Normal breath sounds.  Abdominal:     General: There is no distension.     Palpations: Abdomen is soft.     Tenderness: There is abdominal tenderness in the suprapubic area and left lower quadrant. There is no right CVA tenderness, left CVA tenderness, guarding or rebound.  Musculoskeletal:        General: No tenderness.  Skin:    General: Skin is warm and dry.  Neurological:     Mental Status: She is alert.  Psychiatric:        Behavior: Behavior normal.        Thought Content: Thought content normal.      ED Treatments / Results  Labs (all labs ordered are listed, but only abnormal results are displayed) Labs Reviewed  URINALYSIS, ROUTINE W REFLEX MICROSCOPIC - Abnormal; Notable for the following components:      Result Value   APPearance CLOUDY (*)    Hgb urine dipstick TRACE (*)     Nitrite POSITIVE (*)    All other components within normal limits  URINALYSIS, MICROSCOPIC (REFLEX) - Abnormal; Notable for the following components:   Bacteria, UA MANY (*)    All other components within normal limits  URINE CULTURE  PREGNANCY, URINE    EKG    Radiology No results found.  Procedures Procedures (including critical care time)  Medications Ordered in ED Medications  cephALEXin (KEFLEX) capsule 500 mg (500 mg Oral Given 04/28/19 2035)  ibuprofen (ADVIL) tablet 600 mg (600 mg Oral Given 04/28/19 2034)     Initial Impression / Assessment and Plan / ED Course  I have reviewed the triage vital signs and the nursing notes.  Pertinent labs & imaging results that were available during my care of the patient were reviewed by me and considered in my medical decision making (see chart for details).  UTI. Not pregnant. Only mild tenderness on exam. Abx. PRN nsaids.    Final Clinical Impressions(s) / ED Diagnoses   Final diagnoses:  Acute cystitis without hematuria    ED Discharge Orders         Ordered    cephALEXin (KEFLEX) 500 MG capsule  3 times daily     04/28/19 2028           Raeford RazorKohut, Calvary Difranco, MD 04/29/19 1135

## 2019-04-30 LAB — URINE CULTURE: Culture: >=100000 — AB

## 2019-05-01 ENCOUNTER — Telehealth: Payer: Self-pay | Admitting: Emergency Medicine

## 2019-05-01 NOTE — Telephone Encounter (Signed)
Post ED Visit - Positive Culture Follow-up  Culture report reviewed by antimicrobial stewardship pharmacist: [place] Team []  Elenor Quinones, Pharm.D. []  Heide Guile, Pharm.D., BCPS AQ-ID []  Parks Neptune, Pharm.D., BCPS []  Alycia Rossetti, Pharm.D., BCPS []  [place], Pharm.D., BCPS, AAHIVP []  Legrand Como, Pharm.D., BCPS, AAHIVP []  Salome Arnt, PharmD, BCPS []  Johnnette Gourd, PharmD, BCPS [x]  Hughes Better, PharmD, BCPS []  Leeroy Cha, PharmD []  Laqueta Linden, PharmD, BCPS []  Albertina Parr, PharmD  [place] Team []  Leodis Sias, PharmD []  Lindell Spar, PharmD []  Royetta Asal, PharmD []  Graylin Shiver, Rph []  Rema Fendt) Glennon Mac, PharmD []  Arlyn Dunning, PharmD []  Netta Cedars, PharmD []  Dia Sitter, PharmD []  Leone Haven, PharmD []  Gretta Arab, PharmD []  Theodis Shove, PharmD []  Peggyann Juba, PharmD []  Reuel Boom, PharmD   Positive urine culture Treated with Cephalexin, organism sensitive to the same and no further patient follow-up is required at this time.  Larene Beach Alize Acy 05/01/2019, 9:03 AM
